# Patient Record
Sex: Male | Born: 1990 | Race: White | Hispanic: No | Smoking: Current every day smoker
Health system: Southern US, Community
[De-identification: ages and names within clinical notes are randomized; demographics above are authoritative.]

---

## 2011-07-31 ENCOUNTER — Encounter (HOSPITAL_COMMUNITY): Payer: Self-pay | Admitting: *Deleted

## 2011-07-31 ENCOUNTER — Emergency Department (HOSPITAL_COMMUNITY)
Admission: EM | Admit: 2011-07-31 | Discharge: 2011-07-31 | Disposition: A | Discharge status: Home / Self Care | Payer: Self-pay | Attending: Emergency Medicine | Admitting: Emergency Medicine

## 2011-07-31 DIAGNOSIS — M67919 Unspecified disorder of synovium and tendon, unspecified shoulder: Secondary | ICD-10-CM | POA: Insufficient documentation

## 2011-07-31 DIAGNOSIS — IMO0001 Reserved for inherently not codable concepts without codable children: Secondary | ICD-10-CM | POA: Insufficient documentation

## 2011-07-31 DIAGNOSIS — M7580 Other shoulder lesions, unspecified shoulder: Secondary | ICD-10-CM

## 2011-07-31 DIAGNOSIS — M719 Bursopathy, unspecified: Secondary | ICD-10-CM | POA: Insufficient documentation

## 2011-07-31 MED ORDER — IBUPROFEN 800 MG PO TABS: 800.0000 mg | ORAL_TABLET | Freq: Four times a day (QID) | ORAL | Status: AC | PRN

## 2011-07-31 MED ORDER — IBUPROFEN 800 MG PO TABS
800.0000 mg | ORAL_TABLET | Freq: Once | ORAL | Status: AC
  Administered 2011-07-31: 800 mg via ORAL
  Filled 2011-07-31: qty 1

## 2011-07-31 NOTE — Discharge Instructions (Signed)
Rotator Cuff Tendonitis  The rotator cuff is the collection of all the muscles and tendons (the supraspinatus, infraspinatus, subscapularis, and teres minor muscles and their tendons) that help your shoulder stay in place. This unit holds the head of the upper arm bone (humerus) in the cup (fossa) of the shoulder blade (scapula). Basically, it connects the arm to the shoulder. Tendinitis is a swelling and irritation of the tissue, called cord like structures (tendons) that connect muscle to bone. It usually is caused by overusing the joint involved. When the tissue surrounding a tendon (the synovium) becomes inflamed, it is called tenosynovitis. This also is often the result of overuse in people whose jobs require repetitive (over and over again) types of motion. HOME CARE INSTRUCTIONS   Use a sling or splint for as long as directed by your caregiver until the pain decreases.   Apply ice to the injury for 15 to 20 minutes, 3 to 4 times per day. Put the ice in a plastic bag and place a towel between the bag of ice and your skin.   Try to avoid use other than gentle range of motion while your shoulder is painful. Use and exercise only as directed by your caregiver. Stop exercises or range of motion if pain or discomfort increases, unless directed otherwise by your caregiver.   Only take over-the-counter or prescription medicines for pain, discomfort, or fever as directed by your caregiver.   If you were give a shoulder sling and straps (immobilizer), do not remove it except as directed, or until you see a caregiver for a follow-up examination. If you need to remove it, move your arm as little as possible or as directed.   You may want to sleep on several pillows at night to lessen swelling and pain.  SEEK IMMEDIATE MEDICAL CARE IF:   Pain in your shoulder increases or new pain develops in your arm, hand, or fingers and is not relieved with medications.   You develop new, unexplained symptoms,  especially increased numbness in the hands or loss of strength, or you develop any worsening of the problems which brought you in for care.   Your arm, hand, or fingers are numb or tingling.   Your arm, hand, or fingers are swollen, painful, or turn white or blue.  Document Released: 06/19/2003 Document Revised: 03/18/2011 Document Reviewed: 01/25/2008 [employer] Patient Information 2012 [employer]. 

## 2011-07-31 NOTE — ED Provider Notes (Signed)
History     CSN: [id number]  Arrival date & time 07/31/11  1932   First MD Initiated Contact with Patient 07/31/11 2054      Chief Complaint  Patient presents with  . Shoulder Pain    (Consider location/radiation/quality/duration/timing/severity/associated sxs/prior treatment) HPI Comments: Patient here with right shoulder pain - states that when he was in the [employer] they told him he had right rotator cuff tear - states that he played golf several days ago and that since the pain has returned - reports pain more with movement - has full ROM and denies weakness, numbness or tingling.  Patient is a 21 y.o. male presenting with shoulder pain. The history is provided by the patient. No language interpreter was used.  Shoulder Pain This is a new problem. The current episode started in the past 7 days. The problem occurs constantly. The problem has been unchanged. Associated symptoms include arthralgias and myalgias. Pertinent negatives include no abdominal pain, anorexia, change in bowel habit, chest pain, chills, congestion, coughing, diaphoresis, fatigue, fever, headaches, joint swelling, nausea, neck pain, numbness, rash, sore throat, swollen glands, urinary symptoms, vertigo, visual change, vomiting or weakness. The symptoms are aggravated by twisting. He has tried nothing for the symptoms. The treatment provided no relief.    History reviewed. No pertinent past medical history.  History reviewed. No pertinent past surgical history.  No family history on file.  History  Substance Use Topics  . Smoking status: Current Everyday Smoker  . Smokeless tobacco: Not on file  . Alcohol Use: Yes      Review of Systems  Constitutional: Negative for fever, chills, diaphoresis and fatigue.  HENT: Negative for congestion, sore throat and neck pain.   Respiratory: Negative for cough.   Cardiovascular: Negative for chest pain.  Gastrointestinal: Negative for nausea, vomiting, abdominal pain,  anorexia and change in bowel habit.  Musculoskeletal: Positive for myalgias and arthralgias. Negative for joint swelling.  Skin: Negative for rash.  Neurological: Negative for vertigo, weakness, numbness and headaches.  All other systems reviewed and are negative.    Allergies  Review of patient's allergies indicates no known allergies.  Home Medications   Current Outpatient Rx  Name Route Sig Dispense Refill  . GOODY HEADACHE PO Oral Take 1 packet by mouth once.      BP 113/55  Pulse 81  Temp(Src) 98.5 F (36.9 C) (Oral)  Resp 18  SpO2 100%  Physical Exam  Nursing note and vitals reviewed. Constitutional: He is oriented to person, place, and time. He appears well-developed and well-nourished. No distress.  HENT:  Head: Normocephalic and atraumatic.  Right Ear: External ear normal.  Left Ear: External ear normal.  Nose: Nose normal.  Mouth/Throat: Oropharynx is clear and moist. No oropharyngeal exudate.  Eyes: Conjunctivae are normal. Pupils are equal, round, and reactive to light. No scleral icterus.  Neck: Normal range of motion. Neck supple.  Cardiovascular: Normal rate, regular rhythm and normal heart sounds.  Exam reveals no gallop and no friction rub.   No murmur heard. Pulmonary/Chest: Effort normal and breath sounds normal. No respiratory distress. He has no wheezes. He has no rales. He exhibits no tenderness.  Abdominal: Soft. Bowel sounds are normal. He exhibits no distension. There is no tenderness.  Musculoskeletal: Normal range of motion.       Full ROM of right shoulder - pain to palpation of insertion of supraspinatus, no signs of impingement - no sensory deficit distal to the pain.  Lymphadenopathy:  He has no cervical adenopathy.  Neurological: He is alert and oriented to person, place, and time. No cranial nerve deficit.  Skin: Skin is warm and dry. No rash noted. No erythema. No pallor.  Psychiatric: He has a normal mood and affect. His behavior is  normal. Judgment and thought content normal.    ED Course  Procedures (including critical care time)  Labs Reviewed - No data to display No results found.   Rotator Cuff tendonitis    MDM  Though patient reports a PMH for rotator cuff tear, I find no decrease in ROM or signs of impingement on physical exam.  I will place him in a sling for support, RICE the injury and refer to ortho.        Izola Price [place], [place] 07/31/11 2116

## 2011-07-31 NOTE — Progress Notes (Signed)
Orthopedic Tech Progress Note Patient Details:  Earl Marks February 08, 1991 [id number]  Other Ortho Devices Type of Ortho Device: Other (comment) (sling immobilizer) Ortho Device Location: (R) UE Ortho Device Interventions: Application   [name] 07/31/2011, 9:07 PM

## 2011-07-31 NOTE — ED Notes (Signed)
The pt has  Had rt shoulder pain for years more pain for 2-3 days.  No new injury

## 2011-08-01 NOTE — ED Provider Notes (Signed)
Medical screening examination/treatment/procedure(s) were performed by non-physician practitioner and as supervising physician I was immediately available for consultation/collaboration.   Carleene Cooper III, MD 08/01/11 [phone]

## 2011-09-07 ENCOUNTER — Encounter (HOSPITAL_COMMUNITY): Payer: Self-pay | Admitting: *Deleted

## 2011-09-07 ENCOUNTER — Emergency Department (HOSPITAL_COMMUNITY)
Admission: EM | Admit: 2011-09-07 | Discharge: 2011-09-07 | Disposition: A | Discharge status: Home / Self Care | Payer: Self-pay | Attending: Emergency Medicine | Admitting: Emergency Medicine

## 2011-09-07 ENCOUNTER — Emergency Department (HOSPITAL_COMMUNITY): Payer: Self-pay

## 2011-09-07 DIAGNOSIS — F17200 Nicotine dependence, unspecified, uncomplicated: Secondary | ICD-10-CM | POA: Insufficient documentation

## 2011-09-07 DIAGNOSIS — Y990 Civilian activity done for income or pay: Secondary | ICD-10-CM | POA: Insufficient documentation

## 2011-09-07 DIAGNOSIS — W230XXA Caught, crushed, jammed, or pinched between moving objects, initial encounter: Secondary | ICD-10-CM | POA: Insufficient documentation

## 2011-09-07 DIAGNOSIS — S6010XA Contusion of unspecified finger with damage to nail, initial encounter: Secondary | ICD-10-CM

## 2011-09-07 DIAGNOSIS — S6000XA Contusion of unspecified finger without damage to nail, initial encounter: Secondary | ICD-10-CM | POA: Insufficient documentation

## 2011-09-07 MED ORDER — IBUPROFEN 600 MG PO TABS: 600.0000 mg | ORAL_TABLET | Freq: Four times a day (QID) | ORAL | Status: AC | PRN

## 2011-09-07 MED ORDER — HYDROCODONE-ACETAMINOPHEN 5-500 MG PO TABS
1.0000 | ORAL_TABLET | Freq: Four times a day (QID) | ORAL | Status: AC | PRN
Start: 2011-09-07 — End: 2011-09-17

## 2011-09-07 NOTE — ED Notes (Signed)
Patient dropped cement block on right index finger, patient with bruising  to nail bed and decreased rom in finger

## 2011-09-07 NOTE — Discharge Instructions (Signed)
Elevate finger. Ice/coldpack to sore area. Take motrin as need for pain. You may also take vicodin as need for pain. No driving when taking vicodin. Also, do not take tylenol or acetaminophen containing medication when taking vicodin. Return to ER if worse, new symptoms, infection of finger, severe pain, other concern.     Cryotherapy Cryotherapy means treatment with cold. Ice or gel packs can be used to reduce both pain and swelling. Ice is the most helpful within the first 24 to 48 hours after an injury or flareup from overusing a muscle or joint. Sprains, strains, spasms, burning pain, shooting pain, and aches can all be eased with ice. Ice can also be used when recovering from surgery. Ice is effective, has very few side effects, and is safe for most people to use. PRECAUTIONS  Ice is not a safe treatment option for people with:  Raynaud's phenomenon. This is a condition affecting small blood vessels in the extremities. Exposure to cold may cause your problems to return.   Cold hypersensitivity. There are many forms of cold hypersensitivity, including:   Cold urticaria. Red, itchy hives appear on the skin when the tissues begin to warm after being iced.   Cold erythema. This is a red, itchy rash caused by exposure to cold.   Cold hemoglobinuria. Red blood cells break down when the tissues begin to warm after being iced. The hemoglobin that carry oxygen are passed into the urine because they cannot combine with blood proteins fast enough.   Numbness or altered sensitivity in the area being iced.  If you have any of the following conditions, do not use ice until you have discussed cryotherapy with your caregiver:  Heart conditions, such as arrhythmia, angina, or chronic heart disease.   High blood pressure.   Healing wounds or open skin in the area being iced.   Current infections.   Rheumatoid arthritis.   Poor circulation.   Diabetes.  Ice slows the blood flow in the region  it is applied. This is beneficial when trying to stop inflamed tissues from spreading irritating chemicals to surrounding tissues. However, if you expose your skin to cold temperatures for too long or without the proper protection, you can damage your skin or nerves. Watch for signs of skin damage due to cold. HOME CARE INSTRUCTIONS Follow these tips to use ice and cold packs safely.  Place a dry or damp towel between the ice and skin. A damp towel will cool the skin more quickly, so you may need to shorten the time that the ice is used.   For a more rapid response, add gentle compression to the ice.   Ice for no more than 10 to 20 minutes at a time. The bonier the area you are icing, the less time it will take to get the benefits of ice.   Check your skin after 5 minutes to make sure there are no signs of a poor response to cold or skin damage.   Rest 20 minutes or more in between uses.   Once your skin is numb, you can end your treatment. You can test numbness by very lightly touching your skin. The touch should be so light that you do not see the skin dimple from the pressure of your fingertip. When using ice, most people will feel these normal sensations in this order: cold, burning, aching, and numbness.   Do not use ice on someone who cannot communicate their responses to pain, such as small children  or people with dementia.  HOW TO MAKE AN ICE PACK Ice packs are the most common way to use ice therapy. Other methods include ice massage, ice baths, and cryo-sprays. Muscle creams that cause a cold, tingly feeling do not offer the same benefits that ice offers and should not be used as a substitute unless recommended by your caregiver. To make an ice pack, do one of the following:  Place crushed ice or a bag of frozen vegetables in a sealable plastic bag. Squeeze out the excess air. Place this bag inside another plastic bag. Slide the bag into a pillowcase or place a damp towel between your  skin and the bag.   Mix 3 parts water with 1 part rubbing alcohol. Freeze the mixture in a sealable plastic bag. When you remove the mixture from the freezer, it will be slushy. Squeeze out the excess air. Place this bag inside another plastic bag. Slide the bag into a pillowcase or place a damp towel between your skin and the bag.  SEEK MEDICAL CARE IF:  You develop white spots on your skin. This may give the skin a blotchy (mottled) appearance.   Your skin turns blue or pale.   Your skin becomes waxy or hard.   Your swelling gets worse.  MAKE SURE YOU:   Understand these instructions.   Will watch your condition.   Will get help right away if you are not doing well or get worse.  Document Released: 11/23/2010 Document Revised: 03/18/2011 Document Reviewed: 11/23/2010 [facility] Patient Information 2012 [place], [place].

## 2011-09-07 NOTE — ED Provider Notes (Signed)
History  Scribed for Earl Roots, MD, the patient was seen in room STRE2/STRE2. This chart was scribed by Candelaria Stagers. The patient's care started at 11:36 AM    CSN: [id number]  Arrival date & time 09/07/11  1107   None     Chief Complaint  Patient presents with  . Finger Injury    right index    Hand Injury  The incident occurred 1 to 3 hours ago. The incident occurred at work. Injury mechanism: smashed between cement blocks. Pain location: right index finger. The pain does not radiate. The pain has been constant since the incident. Pertinent negatives include no numbness. He has tried nothing for the symptoms.   [name] is a 21 y.o. 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  Pt denies numbness or loss of sensation to the hand.  There are no lacerations.  The right index finger is swollen.   History reviewed. No pertinent past medical history.  History reviewed. No pertinent past surgical history.  No family history on file.  History  Substance Use Topics  . Smoking status: Current Everyday Smoker  . Smokeless tobacco: Not on file  . Alcohol Use: Yes      Review of Systems  Musculoskeletal: Positive for joint swelling.       Injury to the index finger of the right hand  Neurological: Negative for weakness and numbness.    Allergies  Review of patient's allergies indicates no known allergies.  Home Medications   Current Outpatient Rx  Name Route Sig Dispense Refill  . GOODY HEADACHE PO Oral Take 1 packet by mouth once.      BP 111/53  Temp(Src) 97.9 F (36.6 C) (Oral)  Resp 20  SpO2 100%  Physical Exam  Nursing note and vitals reviewed. Constitutional: He is oriented to person, place, and time. He appears well-developed and well-nourished. No distress.  HENT:  Head:  Normocephalic and atraumatic.  Eyes: EOM are normal. Right eye exhibits no discharge. Left eye exhibits no discharge.  Musculoskeletal:       Pain and swelling to the index finger of the right hand. Large subungual hematoma. Normal cap refill distally. Normal tendon fxn. Skin intact.   Neurological: He is alert and oriented to person, place, and time.       Finger nvi.   Skin: He is not diaphoretic.  Psychiatric: He has a normal mood and affect. His behavior is normal.    ED Course  Procedures  DIAGNOSTIC STUDIES: Oxygen Saturation is 100% on room air, normal by my interpretation.    COORDINATION OF CARE:  11:38 AM DG Finger Right Index    Labs Reviewed - No data to display Dg Finger Index Right  09/07/2011  *RADIOLOGY REPORT*  Clinical Data: Pain after trauma  RIGHT INDEX FINGER 2+V  Comparison: None.  Findings: There is no evidence of bone, joint, or soft tissue abnormality.  IMPRESSION: Normal right index finger.  Original Report Authenticated By: Brandon Melnick, M.D.        MDM  I personally performed the services described in this documentation, which was scribed in my presence. The recorded information has been reviewed and considered. Earl Roots, MD   Pt with approximately 75% subungual hematoma,   Digit block with 2% lidocaine without epi Sterile prep, betadine/alc swabs  Total 5 cc used.  Good anesthetic effect. Normal cap refill distally.    Nail trephinated with disposable cautery. Tolerated well. Dressing.           Earl Roots, MD 09/07/11 1259

## 2012-03-03 ENCOUNTER — Emergency Department (HOSPITAL_COMMUNITY)
Admission: EM | Admit: 2012-03-03 | Discharge: 2012-03-03 | Disposition: A | Discharge status: Home / Self Care | Payer: Self-pay | Attending: Emergency Medicine | Admitting: Emergency Medicine

## 2012-03-03 ENCOUNTER — Encounter (HOSPITAL_COMMUNITY): Payer: Self-pay | Admitting: *Deleted

## 2012-03-03 DIAGNOSIS — K089 Disorder of teeth and supporting structures, unspecified: Secondary | ICD-10-CM | POA: Insufficient documentation

## 2012-03-03 DIAGNOSIS — R51 Headache: Secondary | ICD-10-CM | POA: Insufficient documentation

## 2012-03-03 DIAGNOSIS — K029 Dental caries, unspecified: Secondary | ICD-10-CM

## 2012-03-03 DIAGNOSIS — Z7982 Long term (current) use of aspirin: Secondary | ICD-10-CM | POA: Insufficient documentation

## 2012-03-03 DIAGNOSIS — K0889 Other specified disorders of teeth and supporting structures: Secondary | ICD-10-CM

## 2012-03-03 DIAGNOSIS — H9209 Otalgia, unspecified ear: Secondary | ICD-10-CM | POA: Insufficient documentation

## 2012-03-03 DIAGNOSIS — F17200 Nicotine dependence, unspecified, uncomplicated: Secondary | ICD-10-CM | POA: Insufficient documentation

## 2012-03-03 MED ORDER — OXYCODONE-ACETAMINOPHEN 5-325 MG PO TABS: 1.0000 | ORAL_TABLET | ORAL | Status: DC | PRN

## 2012-03-03 MED ORDER — PENICILLIN V POTASSIUM 500 MG PO TABS: 500.0000 mg | ORAL_TABLET | Freq: Four times a day (QID) | ORAL | Status: AC

## 2012-03-03 MED ORDER — OXYCODONE-ACETAMINOPHEN 5-325 MG PO TABS
2.0000 | ORAL_TABLET | Freq: Once | ORAL | Status: AC
  Administered 2012-03-03: 2 via ORAL
  Filled 2012-03-03: qty 2

## 2012-03-03 NOTE — ED Notes (Signed)
Patient is alert and orientedx4.  Patient was explained discharge instructions and he understood them and had no questions.

## 2012-03-03 NOTE — ED Notes (Signed)
Patient said he has a tooth ache for several months and he usually takes tylenol or ibuprofen for the pain and it relieved it. Today he came in because nothing is relieving the pain.  Patient did not realize one of his teeth on the top right side of his mouth was broken.  Patient came in to be evaluated.

## 2012-03-03 NOTE — ED Notes (Signed)
C/o bilateral upper toothache and lower R toothache (3 quadrants), comes and goes, onset today at 1330, (denies: nv, fever, drainage, dizziness), admits to some possible swelling & R earache. Tried ibuprofen, oragel and goodies powder w/o relief.

## 2012-03-03 NOTE — ED Provider Notes (Signed)
History     CSN: [id number]  Arrival date & time 03/03/12  2144   First MD Initiated Contact with Patient 03/03/12 2243      Chief Complaint  Patient presents with  . Dental Pain    (Consider location/radiation/quality/duration/timing/severity/associated sxs/prior treatment) HPI  Earl Marks is a 21 y.o. male presents emergency department complaining of toothache.  He states he has a history of cavities that he woke this morning with his teeth hurting.  Patient states he has pain in the upper right, lower right and upper left.  He states the pain began gradually, has been persistent and is gradually worsening. He states he does not have a dentist in the area. He has associated bad taste in his mouth, swelling of the gums and pain at the site.  He also has associated headache and otalgia the right ear. Nothing makes the pain better, cold drinks makes the pain worse. Patient has tried ibuprofen, Orajel and Goody's powder without relief. Patient denies fever, chills, neck pain, stiffness, nausea, vomiting.    History reviewed. No pertinent past medical history.  History reviewed. No pertinent past surgical history.  No family history on file.  History  Substance Use Topics  . Smoking status: Current Every Day Smoker  . Smokeless tobacco: Not on file  . Alcohol Use: Yes      Review of Systems  Constitutional: Negative for fever, chills and appetite change.  HENT: Positive for ear pain (right) and dental problem. Negative for nosebleeds, facial swelling, rhinorrhea, drooling, trouble swallowing, neck pain, neck stiffness and postnasal drip.   Eyes: Negative for pain and redness.  Respiratory: Negative for cough and wheezing.   Cardiovascular: Negative for chest pain.  Gastrointestinal: Negative for nausea, vomiting and abdominal pain.  Skin: Negative for color change and rash.  Neurological: Positive for headaches. Negative for weakness and light-headedness.  All other systems  reviewed and are negative.    Allergies  Review of patient's allergies indicates no known allergies.  Home Medications   Current Outpatient Rx  Name  Route  Sig  Dispense  Refill  . ASPIRIN-ACETAMINOPHEN 500-325 MG PO PACK   Oral   Take 1 packet by mouth 2 (two) times daily as needed. For pain         . IBUPROFEN 200 MG PO TABS   Oral   Take 400 mg by mouth every 8 (eight) hours as needed. For pain         . OXYCODONE-ACETAMINOPHEN 5-325 MG PO TABS   Oral   Take 1 tablet by mouth every 4 (four) hours as needed for pain.   12 tablet   0   . PENICILLIN V POTASSIUM 500 MG PO TABS   Oral   Take 1 tablet (500 mg total) by mouth 4 (four) times daily.   40 tablet   0     BP 140/76  Pulse 89  Temp 98.2 F (36.8 C) (Oral)  Resp 18  SpO2 100%  Physical Exam  Nursing note and vitals reviewed. Constitutional: He appears well-developed and well-nourished.  HENT:  Head: Normocephalic and atraumatic.  Right Ear: Tympanic membrane, external ear and ear canal normal.  Left Ear: Tympanic membrane, external ear and ear canal normal.  Nose: Nose normal. Right sinus exhibits no maxillary sinus tenderness and no frontal sinus tenderness. Left sinus exhibits no maxillary sinus tenderness and no frontal sinus tenderness.  Mouth/Throat: Uvula is midline, oropharynx is clear and moist and mucous membranes are normal. No  oral lesions. Abnormal dentition. Dental caries present. No uvula swelling or lacerations. No oropharyngeal exudate, posterior oropharyngeal edema, posterior oropharyngeal erythema or tonsillar abscesses.    Eyes: Conjunctivae normal are normal. Right eye exhibits no discharge. Left eye exhibits no discharge.  Neck: Normal range of motion. Neck supple.  Cardiovascular: Normal rate, regular rhythm and normal heart sounds.   Pulmonary/Chest: Effort normal and breath sounds normal. No respiratory distress. He has no wheezes.  Lymphadenopathy:    He has no cervical  adenopathy.  Neurological: He is alert.  Skin: Skin is warm and dry. No rash noted.  Psychiatric: He has a normal mood and affect.    ED Course  Procedures (including critical care time)  Labs Reviewed - No data to display No results found.   1. Pain, dental   2. Dental caries       MDM  [name] is a dental pain and history of dental caries.  Patient with toothache.  No gross abscess.  Exam unconcerning for Ludwig's angina or spread of infection.  Will treat with penicillin and pain medicine.  Urged patient to follow-up with dentist.    1. Medications: Percocet, penicillin  2. Treatment: Rest, drink plenty of fluids, take medication as prescribed  3. Follow Up: With dentistry on Monday    Dierdre Forth, PA-C 03/03/12 2356

## 2012-03-04 NOTE — ED Provider Notes (Signed)
Medical screening examination/treatment/procedure(s) were performed by non-physician practitioner and as supervising physician I was immediately available for consultation/collaboration.  Donnetta Hutching, MD 03/04/12 [phone]

## 2012-03-29 ENCOUNTER — Emergency Department (HOSPITAL_COMMUNITY)
Admission: EM | Admit: 2012-03-29 | Discharge: 2012-03-29 | Discharge status: Against Medical Advice | Payer: Self-pay | Attending: Emergency Medicine | Admitting: Emergency Medicine

## 2012-03-29 ENCOUNTER — Encounter (HOSPITAL_COMMUNITY): Payer: Self-pay

## 2012-03-29 DIAGNOSIS — K089 Disorder of teeth and supporting structures, unspecified: Secondary | ICD-10-CM | POA: Insufficient documentation

## 2012-03-29 MED ORDER — BUPIVACAINE-EPINEPHRINE PF 0.5-1:200000 % IJ SOLN
1.8000 mL | Freq: Once | INTRAMUSCULAR | Status: DC
  Filled 2012-03-29: qty 1.8

## 2012-03-29 MED ORDER — BUPIVACAINE-EPINEPHRINE PF 0.5-1:200000 % IJ SOLN
INTRAMUSCULAR | Status: AC
  Filled 2012-03-29: qty 1.8

## 2012-03-29 NOTE — ED Notes (Signed)
Patient is no longer in the room. The patient's friend who he arrived with is no longer sitting in the waiting room. The patient asked to go to the restroom , and then has left. Awaited patients return to room x 10 minutes

## 2012-03-29 NOTE — ED Notes (Addendum)
Patient reports that he has been having dental pain x 2 weeks. Patient has 2 decayed teeth right upper and lower and right lower.  Patient states he has an appointment with a dentist next week with Dr. Warren Danes. Patient reports that he is out of pain medication/Oxycodone 5/325 mg.

## 2012-08-21 ENCOUNTER — Emergency Department (HOSPITAL_COMMUNITY)
Admission: EM | Admit: 2012-08-21 | Discharge: 2012-08-21 | Disposition: A | Discharge status: Home / Self Care | Payer: Commercial Indemnity | Attending: Emergency Medicine | Admitting: Emergency Medicine

## 2012-08-21 ENCOUNTER — Emergency Department (HOSPITAL_COMMUNITY): Payer: Commercial Indemnity

## 2012-08-21 ENCOUNTER — Encounter (HOSPITAL_COMMUNITY): Payer: Self-pay | Admitting: Emergency Medicine

## 2012-08-21 DIAGNOSIS — Y929 Unspecified place or not applicable: Secondary | ICD-10-CM | POA: Insufficient documentation

## 2012-08-21 DIAGNOSIS — F17200 Nicotine dependence, unspecified, uncomplicated: Secondary | ICD-10-CM | POA: Insufficient documentation

## 2012-08-21 DIAGNOSIS — S60229A Contusion of unspecified hand, initial encounter: Secondary | ICD-10-CM | POA: Insufficient documentation

## 2012-08-21 DIAGNOSIS — Y9389 Activity, other specified: Secondary | ICD-10-CM | POA: Insufficient documentation

## 2012-08-21 DIAGNOSIS — S60221A Contusion of right hand, initial encounter: Secondary | ICD-10-CM

## 2012-08-21 DIAGNOSIS — W3189XA Contact with other specified machinery, initial encounter: Secondary | ICD-10-CM | POA: Insufficient documentation

## 2012-08-21 MED ORDER — OXYCODONE-ACETAMINOPHEN 5-325 MG PO TABS
1.0000 | ORAL_TABLET | Freq: Once | ORAL | Status: AC
  Administered 2012-08-21: 1 via ORAL
  Filled 2012-08-21: qty 1

## 2012-08-21 MED ORDER — OXYCODONE-ACETAMINOPHEN 5-325 MG PO TABS: 1.0000 | ORAL_TABLET | Freq: Four times a day (QID) | ORAL | Status: DC | PRN

## 2012-08-21 NOTE — ED Provider Notes (Signed)
History  This chart was scribed for [employer]. Rubin Payor, MD by Ardeen Jourdain, ED Scribe. This patient was seen in room TR10C/TR10C and the patient's care was started at 1731.  CSN: [id number]  Arrival date & time 08/21/12  1512   First MD Initiated Contact with Patient 08/21/12 1731      Chief Complaint  Patient presents with  . Hand Pain    The history is provided by the patient. No language interpreter was used.    HPI Comments: Earl Marks is a 22 y.o. male who presents to the Emergency Department complaining of sudden onset, gradually worsening, constant right hand pain that began a few hours ago. Pt states he was working on [employer] when he hit it with his hand. He denies any other injures or symptoms at this time. He denies any numbness in the right hand.   History reviewed. No pertinent past medical history.  History reviewed. No pertinent past surgical history.  History reviewed. No pertinent family history.  History  Substance Use Topics  . Smoking status: Current Every Day Smoker  . Smokeless tobacco: Never Used  . Alcohol Use: No      Review of Systems  Musculoskeletal: Positive for arthralgias.       Right hand pain   All other systems reviewed and are negative.    Allergies  Review of patient's allergies indicates no known allergies.  Home Medications   Current Outpatient Rx  Name  Route  Sig  Dispense  Refill  . acetaminophen (TYLENOL) 325 MG tablet   Oral   Take 650 mg by mouth every 6 (six) hours as needed for pain.         Marland Kitchen oxyCODONE-acetaminophen (PERCOCET/ROXICET) 5-325 MG per tablet   Oral   Take 1-2 tablets by mouth every 6 (six) hours as needed for pain.   10 tablet   0     Triage Vitals: BP 119/67  Pulse 90  Temp(Src) 98.8 F (37.1 C) (Oral)  Resp 18  SpO2 98%  Physical Exam  Nursing note and vitals reviewed. Constitutional: He is oriented to person, place, and time. He appears well-developed and well-nourished. No  distress.  HENT:  Head: Normocephalic and atraumatic.  Eyes: EOM are normal. Pupils are equal, round, and reactive to light.  Neck: Normal range of motion. Neck supple. No tracheal deviation present.  Cardiovascular: Normal rate.   Pulmonary/Chest: Effort normal. No respiratory distress.  Abdominal: Soft. He exhibits no distension.  Musculoskeletal: Normal range of motion. He exhibits no edema.  Erythema and swelling to right 4th and 5th MCP joint, mild ecchymosis over 5th MCP joint. Mild swelling over 1st MCP joint. Mild tenderness over 4th and 5th MCP joints. Sensation intact. No tenderness to elbow or wrist. Skin intact  Neurological: He is alert and oriented to person, place, and time.  Skin: Skin is warm and dry.  Psychiatric: He has a normal mood and affect. His behavior is normal.    ED Course  Procedures (including critical care time)  5:56 PM-Discussed treatment plan which includes x-ray of the right hand and pain medication with pt at bedside and pt agreed to plan.    Labs Reviewed - No data to display Dg Hand Complete Right  08/21/2012  *RADIOLOGY REPORT*  Clinical Data: Pain  RIGHT HAND - COMPLETE 3+ VIEW  Comparison: None.  Findings: Three views of the right hand submitted.  No acute fracture or subluxation.  No radiopaque foreign body.  IMPRESSION:  No acute fracture or subluxation.   Original Report Authenticated By: Natasha Mead, M.D.      1. Contusion, hand, right, initial encounter       MDM  Patient with hand pain after hitting the ground while working on a lawnmower. X-rays negative for fracture. Tenderness over fourth and fifth MCP joints. Skin intact. We'll splint for comfort and will followup with hand as needed.      I personally performed the services described in this documentation, which was scribed in my presence. The recorded information has been reviewed and is accurate.     Juliet Rude. Rubin Payor, MD 08/21/12 1818

## 2012-08-21 NOTE — ED Notes (Signed)
Pt c/o right hand pain after hitting with lawn mower blade; mild swelling noted to knuckles

## 2012-08-21 NOTE — Progress Notes (Signed)
Orthopedic Tech Progress Note Patient Details:  Earl Marks Aug 30, 1990 [id number]  Ortho Devices Type of Ortho Device: Finger splint Ortho Device/Splint Location: RUE Ortho Device/Splint Interventions: Ordered;Application   [name] 08/21/2012, 6:08 PM

## 2012-08-21 NOTE — ED Notes (Signed)
Pt st's he was trying to change lawn mower blade when the wrench slipped and his hand hit the mover.  Pt c/o pain to right hand at 5th MP joint.

## 2012-08-29 ENCOUNTER — Emergency Department (HOSPITAL_COMMUNITY)
Admission: EM | Admit: 2012-08-29 | Discharge: 2012-08-29 | Disposition: A | Discharge status: Home / Self Care | Payer: Commercial Indemnity | Attending: Emergency Medicine | Admitting: Emergency Medicine

## 2012-08-29 ENCOUNTER — Encounter (HOSPITAL_COMMUNITY): Payer: Self-pay | Admitting: Emergency Medicine

## 2012-08-29 ENCOUNTER — Emergency Department (HOSPITAL_COMMUNITY): Payer: Commercial Indemnity

## 2012-08-29 DIAGNOSIS — F17200 Nicotine dependence, unspecified, uncomplicated: Secondary | ICD-10-CM | POA: Insufficient documentation

## 2012-08-29 DIAGNOSIS — Y929 Unspecified place or not applicable: Secondary | ICD-10-CM | POA: Insufficient documentation

## 2012-08-29 DIAGNOSIS — S60221A Contusion of right hand, initial encounter: Secondary | ICD-10-CM

## 2012-08-29 DIAGNOSIS — Z7982 Long term (current) use of aspirin: Secondary | ICD-10-CM | POA: Insufficient documentation

## 2012-08-29 DIAGNOSIS — S60229A Contusion of unspecified hand, initial encounter: Secondary | ICD-10-CM | POA: Insufficient documentation

## 2012-08-29 DIAGNOSIS — Y9389 Activity, other specified: Secondary | ICD-10-CM | POA: Insufficient documentation

## 2012-08-29 DIAGNOSIS — IMO0002 Reserved for concepts with insufficient information to code with codable children: Secondary | ICD-10-CM | POA: Insufficient documentation

## 2012-08-29 MED ORDER — HYDROCODONE-ACETAMINOPHEN 5-325 MG PO TABS
1.0000 | ORAL_TABLET | Freq: Once | ORAL | Status: AC
  Administered 2012-08-29: 1 via ORAL
  Filled 2012-08-29: qty 1

## 2012-08-29 MED ORDER — HYDROCODONE-ACETAMINOPHEN 5-325 MG PO TABS: 1.0000 | ORAL_TABLET | ORAL | Status: DC | PRN

## 2012-08-29 NOTE — ED Notes (Signed)
Pt stated that he was helping fix a board on a deck and his dad accidentally hit him on the hand with a hammer. Swelling to right hand noted.

## 2012-08-29 NOTE — ED Notes (Signed)
Pt discharged.Vital signs stable and GCS 15 

## 2012-08-29 NOTE — ED Notes (Signed)
Pt c/o right hand pain after hitting hand with hammer today

## 2012-08-29 NOTE — Progress Notes (Signed)
Orthopedic Tech Progress Note Patient Details:  Earl Marks 30-Apr-1990 [id number]  Ortho Devices Type of Ortho Device: Finger splint Ortho Device/Splint Location: right hand Ortho Device/Splint Interventions: Application   Nikki Dom 08/29/2012, 7:46 PM

## 2012-08-29 NOTE — ED Provider Notes (Signed)
History  This chart was scribed for non-physician practitioner Dierdre Forth, PA-C working with Dione Booze, MD, by Candelaria Stagers, ED Scribe. This patient was seen in room TR05C/TR05C and the patient's care was started at 7:25 PM   CSN: [id number]  Arrival date & time 08/29/12  1801   First MD Initiated Contact with Patient 08/29/12 1902      Chief Complaint  Patient presents with  . Hand Pain     The history is provided by the patient. No language interpreter was used.   HPI Comments: Earl Marks is a 22 y.o. male who presents to the Emergency Department complaining of right hand pain after hitting the hand with a hammer earlier today.  He is experiencing swelling and a contusion to the right hand.  Pt has had previous injury to this hand.  Bending the fingers makes the pain worse.  He has taken Advil with no relief.    History reviewed. No pertinent past medical history.  History reviewed. No pertinent past surgical history.  History reviewed. No pertinent family history.  History  Substance Use Topics  . Smoking status: Current Every Day Smoker -- 0.50 packs/day  . Smokeless tobacco: Never Used  . Alcohol Use: No      Review of Systems  Musculoskeletal: Positive for arthralgias (right hand pain).  All other systems reviewed and are negative.    Allergies  Review of patient's allergies indicates no known allergies.  Home Medications   Current Outpatient Rx  Name  Route  Sig  Dispense  Refill  . aspirin 325 MG tablet   Oral   Take 325 mg by mouth daily.         Marland Kitchen oxyCODONE-acetaminophen (PERCOCET/ROXICET) 5-325 MG per tablet   Oral   Take 1-2 tablets by mouth every 6 (six) hours as needed for pain.   10 tablet   0   . HYDROcodone-acetaminophen (NORCO/VICODIN) 5-325 MG per tablet   Oral   Take 1 tablet by mouth every 4 (four) hours as needed for pain.   5 tablet   0     BP 102/60  Pulse 98  Temp(Src) 97.3 F (36.3 C) (Oral)  Resp 16   SpO2 99%  Physical Exam  Nursing note and vitals reviewed. Constitutional: He is oriented to person, place, and time. He appears well-developed and well-nourished. No distress.  HENT:  Head: Normocephalic and atraumatic.  Mouth/Throat: Oropharynx is clear and moist. No oropharyngeal exudate.  Eyes: Conjunctivae are normal. Pupils are equal, round, and reactive to light. No scleral icterus.  Neck: Normal range of motion. Neck supple.  Cardiovascular: Normal rate, regular rhythm, normal heart sounds and intact distal pulses.   Capillary refill < 3 sec  Pulmonary/Chest: Effort normal and breath sounds normal. No respiratory distress. He has no wheezes.  Musculoskeletal: Normal range of motion. He exhibits tenderness. He exhibits no edema.       Right hand: He exhibits tenderness and bony tenderness. He exhibits normal range of motion, normal two-point discrimination, normal capillary refill, no deformity, no laceration and no swelling. Normal sensation noted. Normal strength noted.  Normal ROM of right hand.  Tenderness over 4th and 5th MCP joint.   Lymphadenopathy:    He has no cervical adenopathy.  Neurological: He is alert and oriented to person, place, and time. He exhibits normal muscle tone. Coordination normal.  Normal strength and sensation.    Skin: Skin is warm and dry. He is not diaphoretic.  Contusion over 4th  and 5th MCP joint.   Psychiatric: He has a normal mood and affect.    ED Course  Procedures  DIAGNOSTIC STUDIES: Oxygen Saturation is 99% on room air, normal by my interpretation.    COORDINATION OF CARE:  7:27 PM Discussed course of care with pt which includes hand splint and follow up with hand specialist is symptoms do not improve.  Pt understands and agrees.    Labs Reviewed - No data to display Dg Hand Complete Right  08/29/2012   *RADIOLOGY REPORT*  Clinical Data: Hit hand with hammer.  Pain and swelling, mainly in region of little finger.  RIGHT HAND -  COMPLETE 3+ VIEW  Comparison: None.  Findings: No evidence of fracture or other significant bone abnormality.  Alignment is normal.  No evidence of arthropathy. Soft tissues are unremarkable.  IMPRESSION: Negative.   Original Report Authenticated By: Myles Rosenthal, M.D.     1. Contusion, hand, right, initial encounter       MDM  Earl Marks presents for repeat contusion to the right hand.  Patient X-Ray negative for obvious fracture or dislocation. I personally reviewed the imaging tests through PACS system.  I reviewed available ER/hospitalization records through the EMR.  Pain managed in ED. Pt advised to follow up with orthopedics if symptoms persist for possibility of missed fracture diagnosis. Patient given brace while in ED, conservative therapy recommended and discussed. Patient will be dc home & is agreeable with above plan.   I personally performed the services described in this documentation, which was scribed in my presence. The recorded information has been reviewed and is accurate.      Dierdre Forth, PA-C 08/29/12 1939

## 2012-08-30 NOTE — ED Provider Notes (Signed)
Medical screening examination/treatment/procedure(s) were performed by non-physician practitioner and as supervising physician I was immediately available for consultation/collaboration.   Stella Encarnacion, MD 08/30/12 0031 

## 2013-01-30 ENCOUNTER — Encounter (HOSPITAL_COMMUNITY): Payer: Self-pay | Admitting: Emergency Medicine

## 2013-01-30 ENCOUNTER — Emergency Department (HOSPITAL_COMMUNITY)
Admission: EM | Admit: 2013-01-30 | Discharge: 2013-01-30 | Disposition: A | Discharge status: Home / Self Care | Payer: Commercial Indemnity | Attending: Emergency Medicine | Admitting: Emergency Medicine

## 2013-01-30 DIAGNOSIS — F17200 Nicotine dependence, unspecified, uncomplicated: Secondary | ICD-10-CM | POA: Insufficient documentation

## 2013-01-30 DIAGNOSIS — Z7982 Long term (current) use of aspirin: Secondary | ICD-10-CM | POA: Insufficient documentation

## 2013-01-30 DIAGNOSIS — K029 Dental caries, unspecified: Secondary | ICD-10-CM | POA: Insufficient documentation

## 2013-01-30 MED ORDER — TRAMADOL HCL 50 MG PO TABS: 50.0000 mg | ORAL_TABLET | Freq: Four times a day (QID) | ORAL | Status: DC | PRN

## 2013-01-30 MED ORDER — TRAMADOL HCL 50 MG PO TABS
50.0000 mg | ORAL_TABLET | Freq: Once | ORAL | Status: AC
  Administered 2013-01-30: 50 mg via ORAL
  Filled 2013-01-30: qty 1

## 2013-01-30 NOTE — ED Provider Notes (Signed)
CSN: [id number]     Arrival date & time 01/30/13  2010 History   First MD Initiated Contact with Patient 01/30/13 2119     Chief Complaint  Patient presents with  . Dental Pain   (Consider location/radiation/quality/duration/timing/severity/associated sxs/prior Treatment) HPI Comments: Earl Marks states at dinner tonight.  He lost part of his right lower second molar that is now painful.  Has not taken any medication.  Prior to arrival.  He does not have a local dentist.  Patient is a 22 y.o. male presenting with tooth pain. The history is provided by the patient.  Dental Pain Location:  Lower Lower teeth location:  30/RL 1st molar Quality:  Aching Severity:  Mild Onset quality:  Sudden Timing:  Constant Progression:  Unchanged Chronicity:  New Context: dental caries and poor dentition   Relieved by:  None tried Worsened by:  Nothing tried Ineffective treatments:  None tried Associated symptoms: difficulty swallowing   Associated symptoms: no facial pain, no facial swelling, no fever, no gum swelling, no neck pain, no oral bleeding, no oral lesions and no trismus     History reviewed. No pertinent past medical history. History reviewed. No pertinent past surgical history. History reviewed. No pertinent family history. History  Substance Use Topics  . Smoking status: Current Every Day Smoker -- 0.50 packs/day  . Smokeless tobacco: Never Used  . Alcohol Use: No    Review of Systems  Constitutional: Negative for fever and chills.  HENT: Positive for dental problem. Negative for facial swelling and mouth sores.   Musculoskeletal: Negative for neck pain.  Skin: Negative for wound.  All other systems reviewed and are negative.    Allergies  Review of patient's allergies indicates no known allergies.  Home Medications   Current Outpatient Rx  Name  Route  Sig  Dispense  Refill  . acetaminophen (TYLENOL) 325 MG tablet   Oral   Take 650 mg by mouth every 6 (six) hours as  needed for pain.         Marland Kitchen aspirin 325 MG tablet   Oral   Take 325 mg by mouth daily.         . traMADol (ULTRAM) 50 MG tablet   Oral   Take 1 tablet (50 mg total) by mouth every 6 (six) hours as needed for pain.   30 tablet   0    BP 109/67  Pulse 83  Temp(Src) 97 F (36.1 C) (Oral)  Resp 18  Wt 140 lb (63.504 kg)  SpO2 99% Physical Exam  Nursing note and vitals reviewed. Constitutional: He is oriented to person, place, and time. He appears well-developed and well-nourished.  HENT:  Head: Normocephalic.  Right Ear: External ear normal.  Left Ear: External ear normal.  Mouth/Throat: Uvula is midline.    Eyes: Pupils are equal, round, and reactive to light.  Neck: Normal range of motion.  Cardiovascular: Normal rate and regular rhythm.   Pulmonary/Chest: Effort normal.  Musculoskeletal: Normal range of motion.  Lymphadenopathy:    He has no cervical adenopathy.  Neurological: He is alert and oriented to person, place, and time.  Skin: Skin is warm. No erythema.    ED Course  Procedures (including critical care time) Labs Review Labs Reviewed - No data to display Imaging Review No results found.  EKG Interpretation   None       MDM   1. Dental caries     There is no indication for infection or need for antibiotic.  Patient has been given Ultram for pain control, and referral to a, dentist.  He's been encouraged to call first thing in the morning to be seen in the For 48 hours    Arman Filter, NP 01/30/13 2203

## 2013-01-30 NOTE — ED Notes (Signed)
Unable to obtain e-signature due to signature pad not working.  Pt verbalized understanding of d/c and f/u. No additional questions by pt regarding d/c.  Ambulatory at d/c.

## 2013-01-30 NOTE — ED Notes (Signed)
Presents with right lower jaw dental pain from a chipped tooth that began at 6 pm tonight. Multiple dental caries noted to lower jaw. Also c/o chronic right shoulder pain. Worse with movement. Cms intact.

## 2013-01-30 NOTE — ED Notes (Signed)
Pt also c/o right sided chronic shoulder pain

## 2013-02-01 NOTE — ED Provider Notes (Signed)
Medical screening examination/treatment/procedure(s) were performed by non-physician practitioner and as supervising physician I was immediately available for consultation/collaboration.  EKG Interpretation   None        Advik Weatherspoon, MD 02/01/13 1418 

## 2013-03-18 IMAGING — CR DG FINGER INDEX 2+V*R*
3 series · 3 of 3 positions shown · non-contrast
Comparison: None.

CLINICAL DATA: Pain after trauma

RIGHT INDEX FINGER 2+V

[x finger pa right]
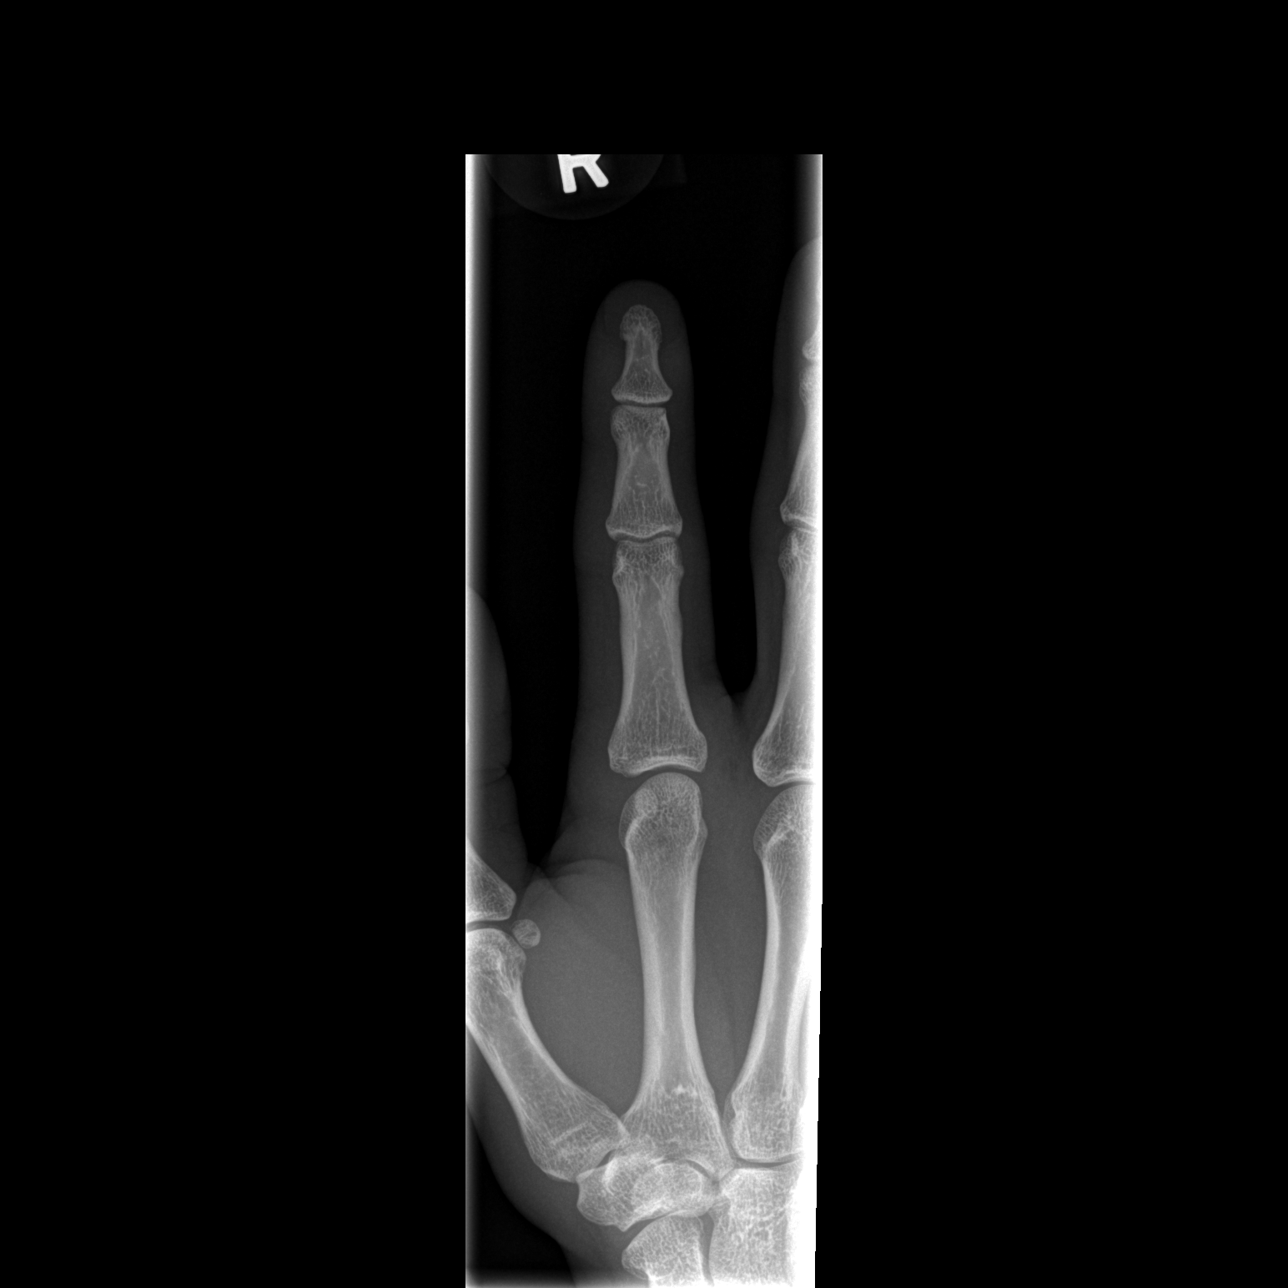

[x finger obl. right]
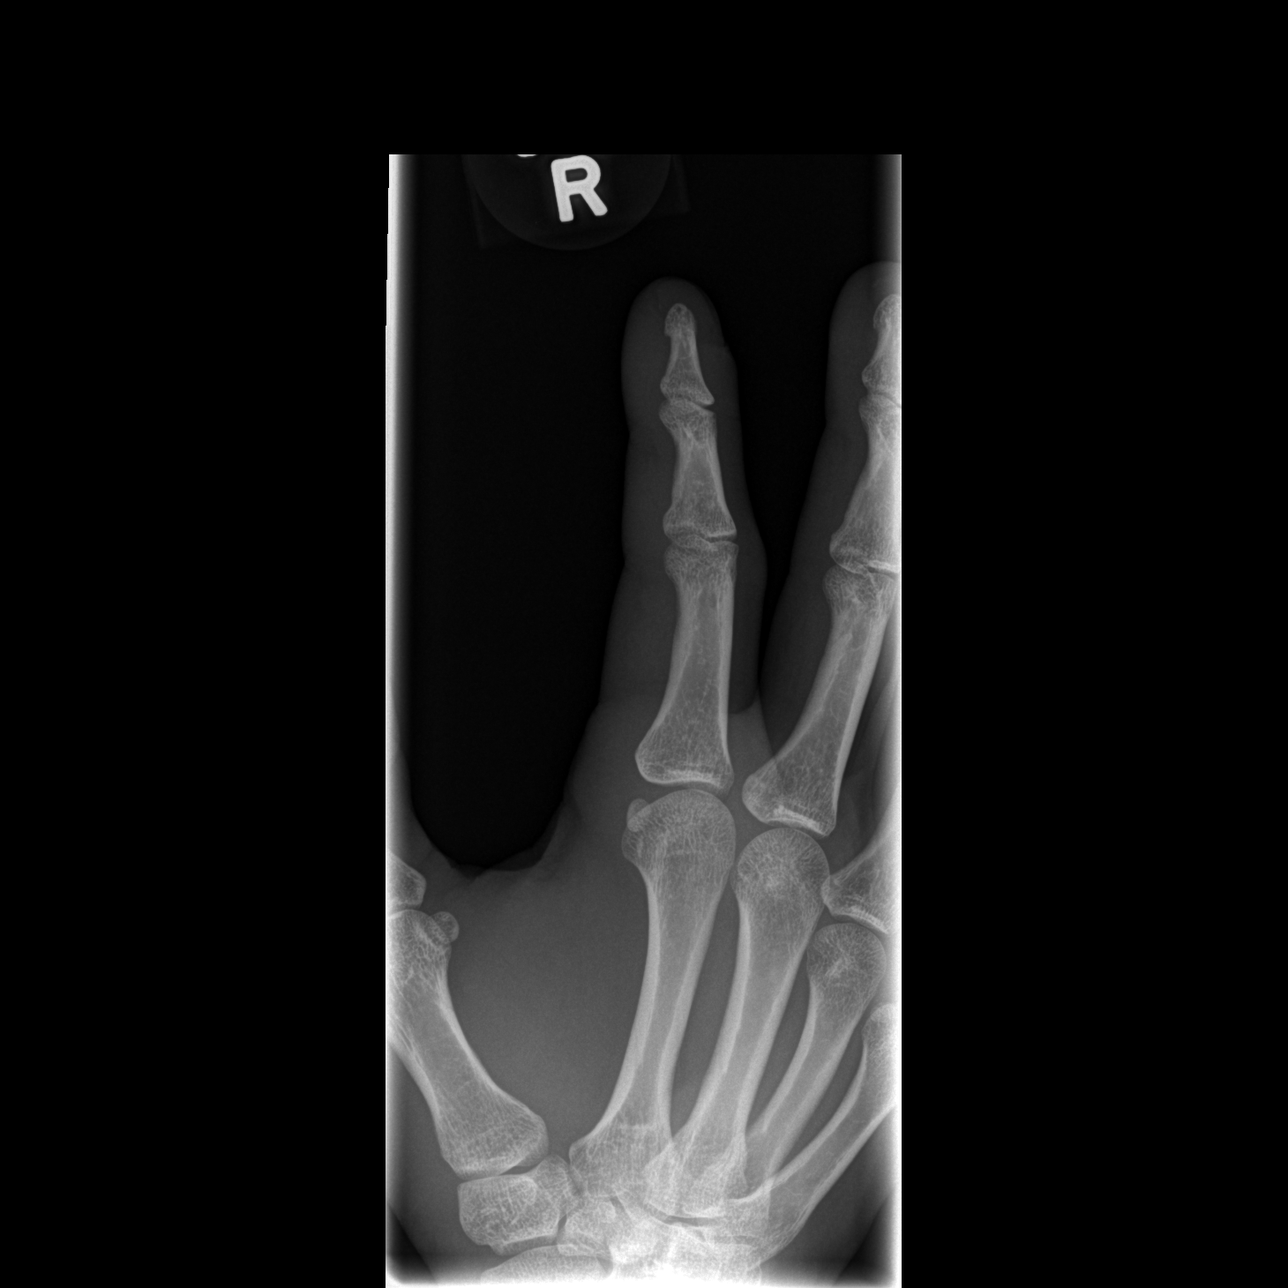

[x finger lateral right]
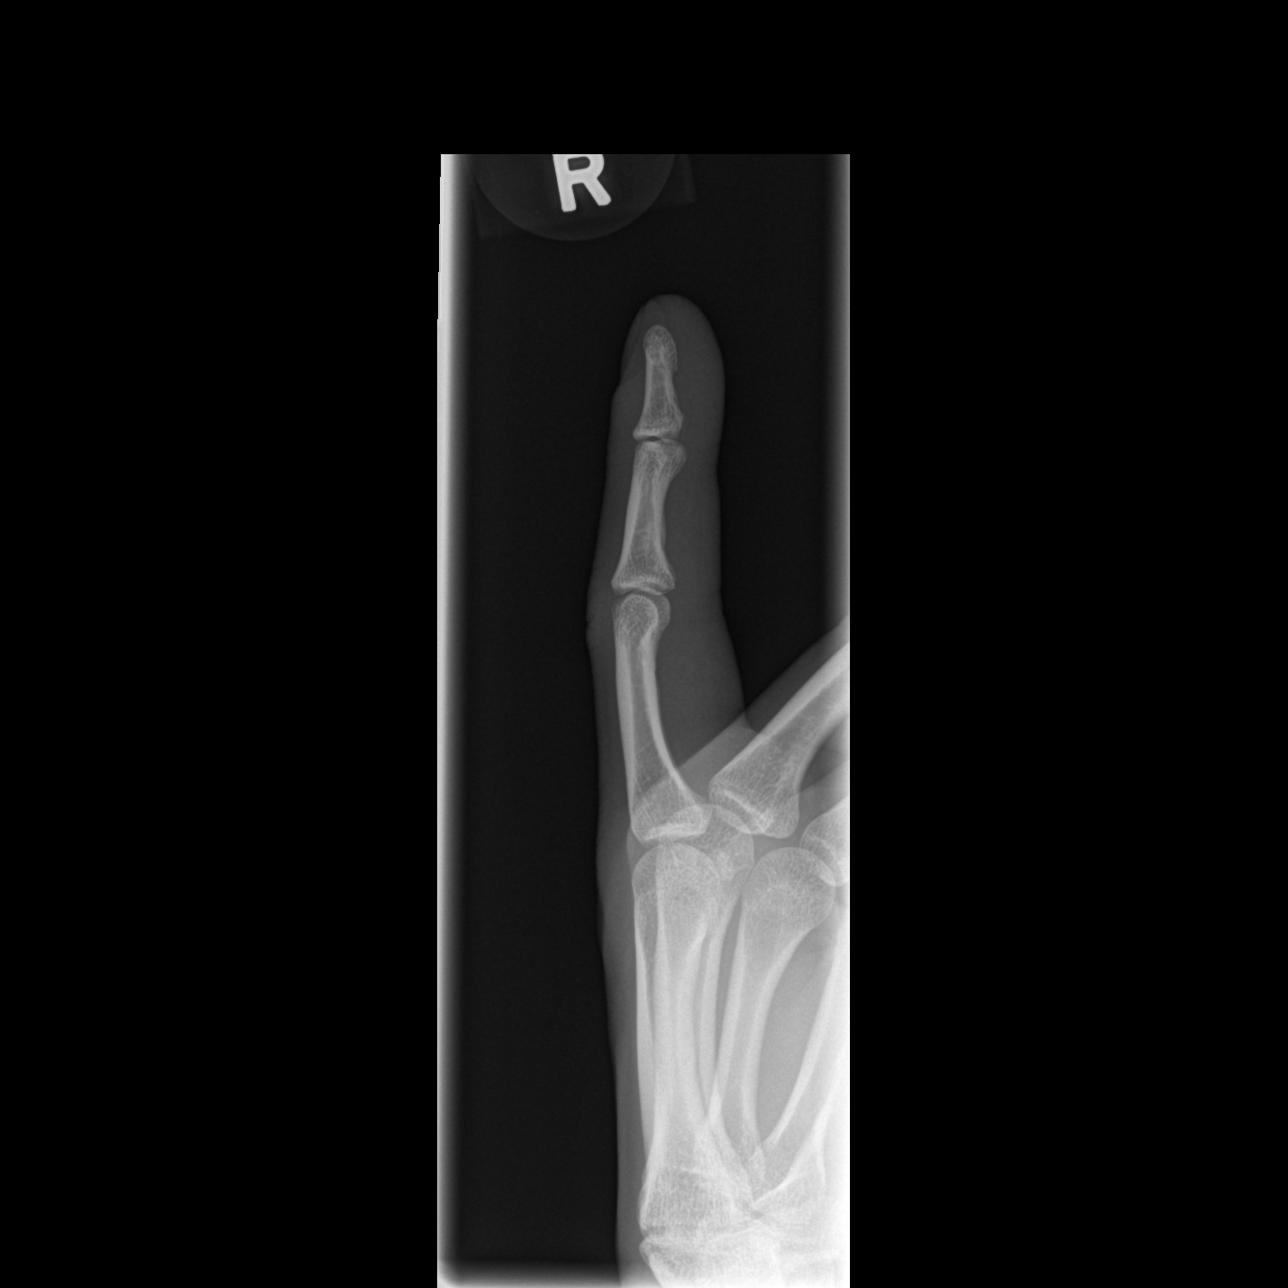

[3 of 3 positions shown; findings below may reference images not displayed]

FINDINGS: There is no evidence of bone, joint, or soft tissue
abnormality.
IMPRESSION: Normal right index finger.

## 2014-03-10 IMAGING — CR DG HAND COMPLETE 3+V*R*
3 series · 3 of 3 positions shown · non-contrast
Comparison: None.

CLINICAL DATA: Hit hand with hammer.  Pain and swelling, mainly in
region of little finger.

RIGHT HAND - COMPLETE 3+ VIEW

[x hand pa right]
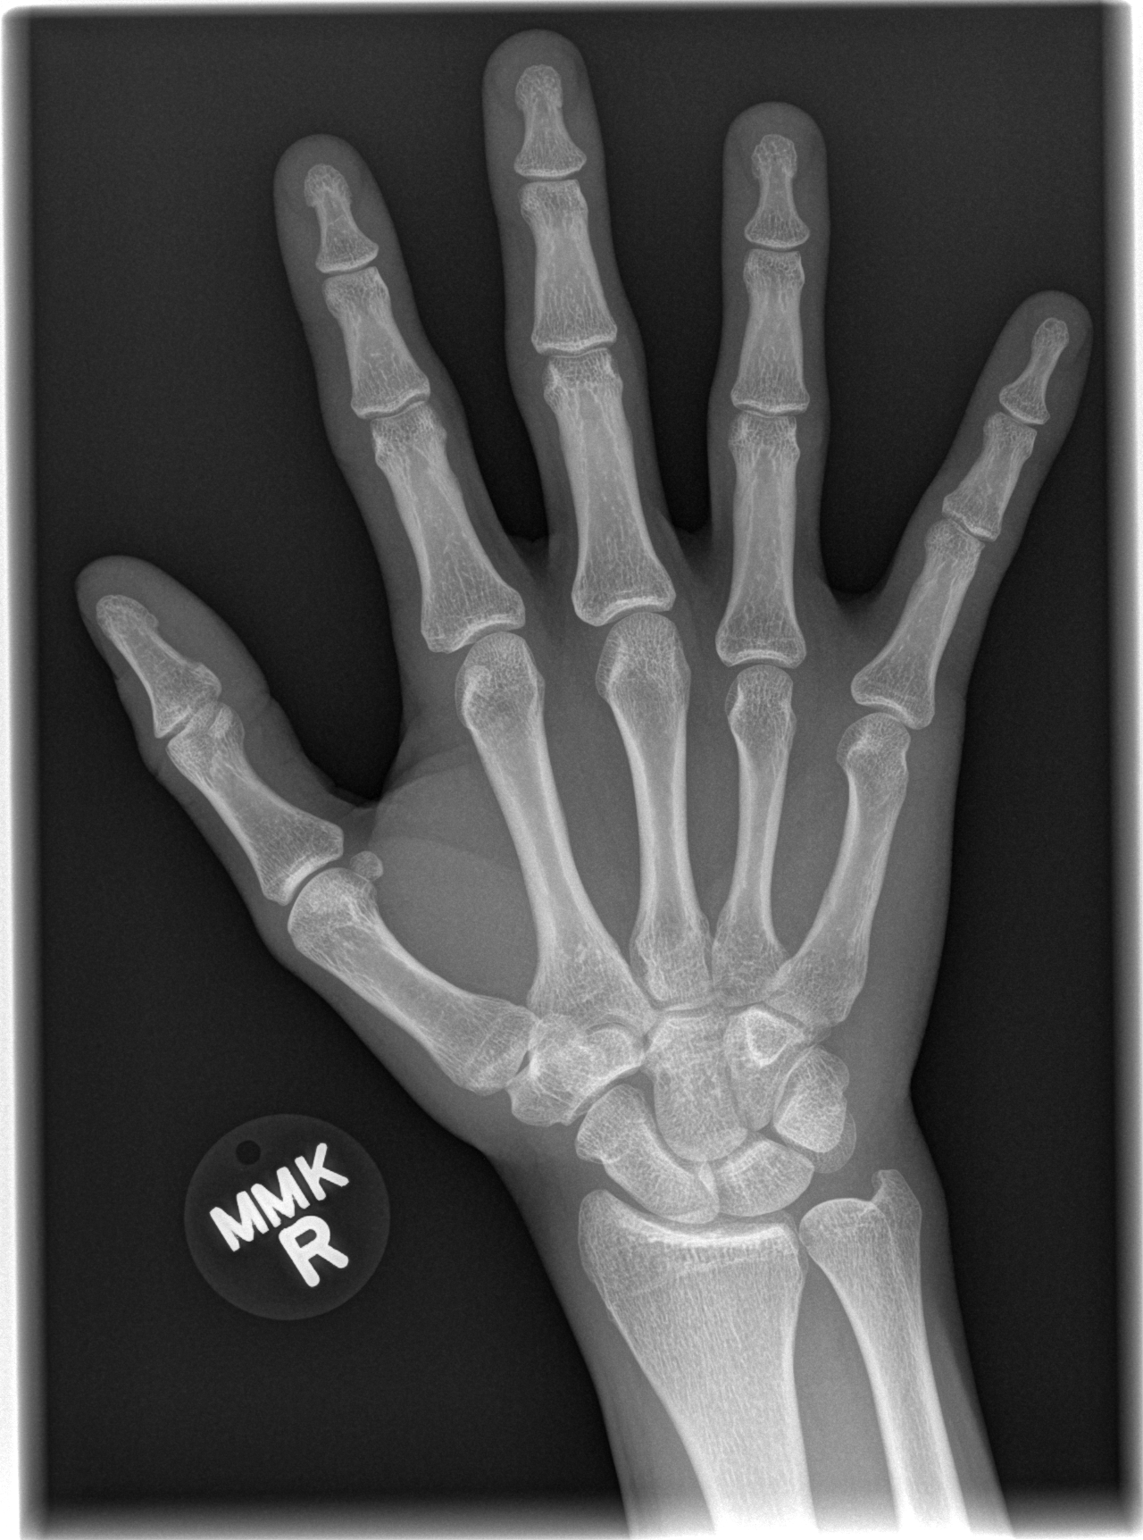

[x hand oblique right]
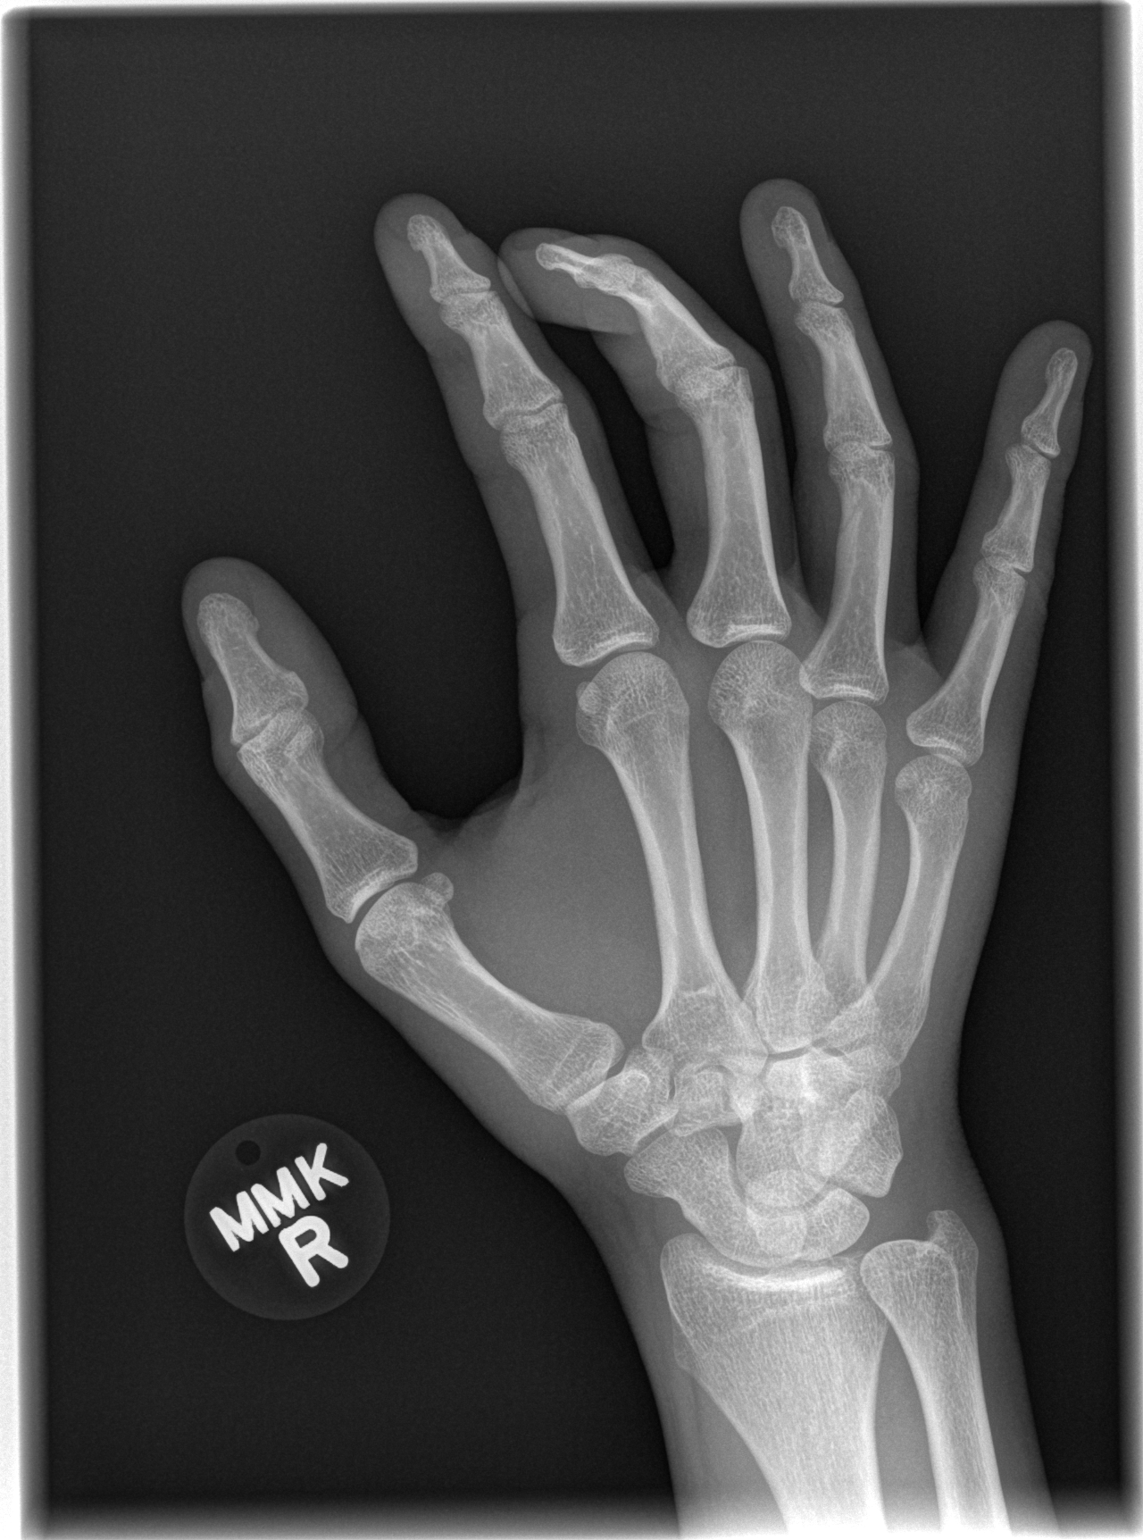

[x hand lat right]
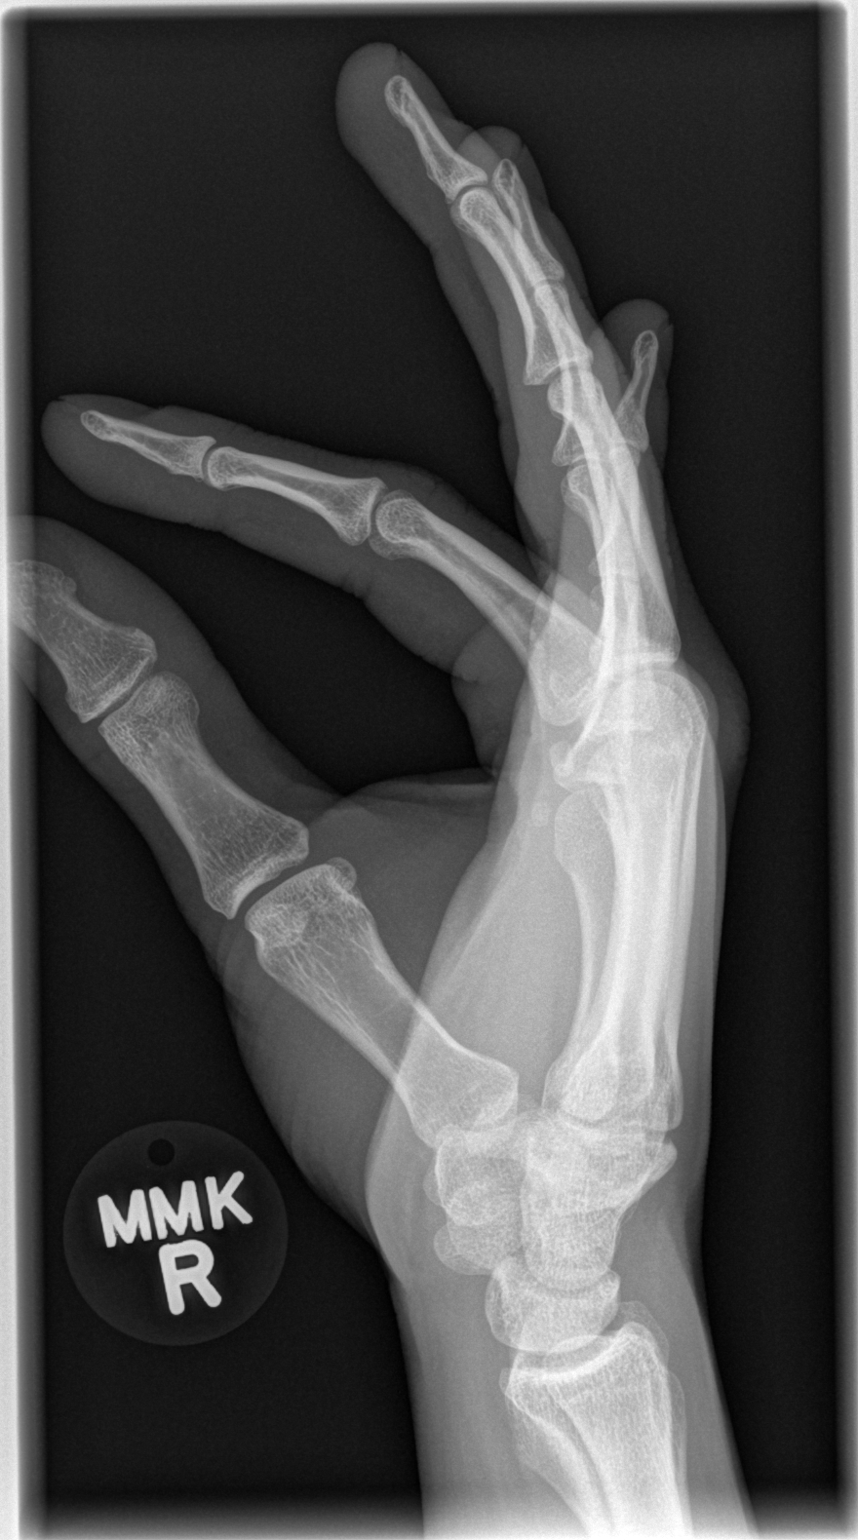

[3 of 3 positions shown; findings below may reference images not displayed]

FINDINGS: No evidence of fracture or other significant bone
abnormality.  Alignment is normal.  No evidence of arthropathy.
Soft tissues are unremarkable.
IMPRESSION: Negative.

## 2014-09-30 ENCOUNTER — Encounter (HOSPITAL_COMMUNITY): Payer: Self-pay | Admitting: *Deleted

## 2014-09-30 ENCOUNTER — Emergency Department (HOSPITAL_COMMUNITY)
Admission: EM | Admit: 2014-09-30 | Discharge: 2014-09-30 | Disposition: A | Discharge status: Home / Self Care | Payer: Commercial Indemnity | Attending: Emergency Medicine | Admitting: Emergency Medicine

## 2014-09-30 DIAGNOSIS — K029 Dental caries, unspecified: Secondary | ICD-10-CM

## 2014-09-30 DIAGNOSIS — Z720 Tobacco use: Secondary | ICD-10-CM | POA: Insufficient documentation

## 2014-09-30 DIAGNOSIS — K002 Abnormalities of size and form of teeth: Secondary | ICD-10-CM | POA: Insufficient documentation

## 2014-09-30 DIAGNOSIS — Z7982 Long term (current) use of aspirin: Secondary | ICD-10-CM | POA: Insufficient documentation

## 2014-09-30 MED ORDER — AMOXICILLIN 500 MG PO CAPS: 500.0000 mg | ORAL_CAPSULE | Freq: Three times a day (TID) | ORAL | Status: DC

## 2014-09-30 NOTE — ED Notes (Signed)
Declined W/C at D/C and was escorted to lobby by RN. 

## 2014-09-30 NOTE — ED Provider Notes (Signed)
CSN: [id number]     Arrival date & time 09/30/14  1503 History   First MD Initiated Contact with Patient 09/30/14 1536     Chief Complaint  Patient presents with  . Dental Pain     (Consider location/radiation/quality/duration/timing/severity/associated sxs/prior Treatment) HPI Comments: 24 y/o M c/o R sided dental pain x 2 days. States he was sitting in line to buy a whopper at [employer] when the pain began. Was not eating at the time. Unsure if the pain is upper or lower, but radiates throughout his right teeth. Unable to describe the pain and not sure how bad it hurts, just states "it hurts". Pain unrelieved by goody powder and tylenol. Does not have a dentist. Denies fever or facial swelling.  Patient is a 24 y.o. male presenting with tooth pain. The history is provided by the patient.  Dental Pain Associated symptoms: no facial swelling and no fever     History reviewed. No pertinent past medical history. History reviewed. No pertinent past surgical history. No family history on file. History  Substance Use Topics  . Smoking status: Current Every Day Smoker -- 0.50 packs/day  . Smokeless tobacco: Never Used  . Alcohol Use: No    Review of Systems  Constitutional: Negative for fever.  HENT: Positive for dental problem. Negative for facial swelling and trouble swallowing.   Respiratory: Negative for apnea.       Allergies  Review of patient's allergies indicates no known allergies.  Home Medications   Prior to Admission medications   Medication Sig Start Date End Date Taking? Authorizing Provider  acetaminophen (TYLENOL) 325 MG tablet Take 650 mg by mouth every 6 (six) hours as needed for pain.    Historical Provider, MD  amoxicillin (AMOXIL) 500 MG capsule Take 1 capsule (500 mg total) by mouth 3 (three) times daily. 09/30/14   Kathrynn Speed, PA-C  aspirin 325 MG tablet Take 325 mg by mouth daily.    Historical Provider, MD  traMADol (ULTRAM) 50 MG tablet Take 1  tablet (50 mg total) by mouth every 6 (six) hours as needed for pain. 01/30/13   Earley Favor, NP   BP 120/60 mmHg  Pulse 92  Temp(Src) 98.7 F (37.1 C) (Oral)  Resp 18  SpO2 100% Physical Exam  Constitutional: He is oriented to person, place, and time. He appears well-developed and well-nourished. No distress.  HENT:  Head: Normocephalic and atraumatic.  Poor dentition. Multiple dental caries and significant tooth decay. R lower posterior gingival inflammation. No dental abscess.  Eyes: Conjunctivae and EOM are normal.  Neck: Normal range of motion. Neck supple.  Cardiovascular: Normal rate, regular rhythm and normal heart sounds.   Pulmonary/Chest: Effort normal and breath sounds normal.  Musculoskeletal: Normal range of motion. He exhibits no edema.  Lymphadenopathy:       Head (right side): No submental and no submandibular adenopathy present.       Head (left side): No submental and no submandibular adenopathy present.    He has no cervical adenopathy.  Neurological: He is alert and oriented to person, place, and time.  Skin: Skin is warm and dry.  Psychiatric: He has a normal mood and affect. His behavior is normal.  Nursing note and vitals reviewed.   ED Course  Procedures (including critical care time) Labs Review Labs Reviewed - No data to display  Imaging Review No results found.   EKG Interpretation None      MDM   Final diagnoses:  Pain due to dental caries  Tooth decay   NAD. Afebrile. Poor dentition. Gingival inflammation noted. No dental abscess or evidence of Ludwig angina. Rx amoxil. Resources for dental f/u given. Stressed importance of f/u. Stable for d/c. Return precautions given. Patient states understanding of treatment care plan and is agreeable.  Kathrynn Speed, PA-C 09/30/14 1612  Pricilla Loveless, MD 10/02/14 [phone]

## 2014-09-30 NOTE — ED Notes (Signed)
Pt states that he has had rt lower tooth pain since yesterday.

## 2014-09-30 NOTE — Discharge Instructions (Signed)
Take amoxicillin as directed for 7 days. Continue tylenol or ibuprofen for pain. Follow up with the dentist.  Dental Pain A tooth ache may be caused by cavities (tooth decay). Cavities expose the nerve of the tooth to air and hot or cold temperatures. It may come from an infection or abscess (also called a boil or furuncle) around your tooth. It is also often caused by dental caries (tooth decay). This causes the pain you are having. DIAGNOSIS  Your caregiver can diagnose this problem by exam. TREATMENT   If caused by an infection, it may be treated with medications which kill germs (antibiotics) and pain medications as prescribed by your caregiver. Take medications as directed.  Only take over-the-counter or prescription medicines for pain, discomfort, or fever as directed by your caregiver.  Whether the tooth ache today is caused by infection or dental disease, you should see your dentist as soon as possible for further care. SEEK MEDICAL CARE IF: The exam and treatment you received today has been provided on an emergency basis only. This is not a substitute for complete medical or dental care. If your problem worsens or new problems (symptoms) appear, and you are unable to meet with your dentist, call or return to this location. SEEK IMMEDIATE MEDICAL CARE IF:   You have a fever.  You develop redness and swelling of your face, jaw, or neck.  You are unable to open your mouth.  You have severe pain uncontrolled by pain medicine. MAKE SURE YOU:   Understand these instructions.  Will watch your condition.  Will get help right away if you are not doing well or get worse. Document Released: 03/29/2005 Document Revised: 06/21/2011 Document Reviewed: 11/15/2007 [facility] Patient Information 2015 [place], [place]. This information is not intended to replace advice given to you by your health care provider. Make sure you discuss any questions you have with your health care provider.  Dental  Caries Dental caries (also called tooth decay) is the most common oral disease. It can occur at any age but is more common in children and young adults.  HOW DENTAL CARIES DEVELOPS  The process of decay begins when bacteria and foods (particularly sugars and starches) combine in your mouth to produce plaque. Plaque is a substance that sticks to the hard, outer surface of a tooth (enamel). The bacteria in plaque produce acids that attack enamel. These acids may also attack the root surface of a tooth (cementum) if it is exposed. Repeated attacks dissolve these surfaces and create holes in the tooth (cavities). If left untreated, the acids destroy the other layers of the tooth.  RISK FACTORS  Frequent sipping of sugary beverages.   Frequent snacking on sugary and starchy foods, especially those that easily get stuck in the teeth.   Poor oral hygiene.   Dry mouth.   Substance abuse such as methamphetamine abuse.   Broken or poor-fitting dental restorations.   Eating disorders.   Gastroesophageal reflux disease (GERD).   Certain radiation treatments to the head and neck. SYMPTOMS In the early stages of dental caries, symptoms are seldom present. Sometimes white, chalky areas may be seen on the enamel or other tooth layers. In later stages, symptoms may include:  Pits and holes on the enamel.  Toothache after sweet, hot, or cold foods or drinks are consumed.  Pain around the tooth.  Swelling around the tooth. DIAGNOSIS  Most of the time, dental caries is detected during a regular dental checkup. A diagnosis is made after  a thorough medical and dental history is taken and the surfaces of your teeth are checked for signs of dental caries. Sometimes special instruments, such as lasers, are used to check for dental caries. Dental X-ray exams may be taken so that areas not visible to the eye (such as between the contact areas of the teeth) can be checked for cavities.  TREATMENT  If  dental caries is in its early stages, it may be reversed with a fluoride treatment or an application of a remineralizing agent at the dental office. Thorough brushing and flossing at home is needed to aid these treatments. If it is in its later stages, treatment depends on the location and extent of tooth destruction:   If a small area of the tooth has been destroyed, the destroyed area will be removed and cavities will be filled with a material such as gold, silver amalgam, or composite resin.   If a large area of the tooth has been destroyed, the destroyed area will be removed and a cap (crown) will be fitted over the remaining tooth structure.   If the center part of the tooth (pulp) is affected, a procedure called a root canal will be needed before a filling or crown can be placed.   If most of the tooth has been destroyed, the tooth may need to be pulled (extracted). HOME CARE INSTRUCTIONS You can prevent, stop, or reverse dental caries at home by practicing good oral hygiene. Good oral hygiene includes:  Thoroughly cleaning your teeth at least twice a day with a toothbrush and dental floss.   Using a fluoride toothpaste. A fluoride mouth rinse may also be used if recommended by your dentist or health care provider.   Restricting the amount of sugary and starchy foods and sugary liquids you consume.   Avoiding frequent snacking on these foods and sipping of these liquids.   Keeping regular visits with a dentist for checkups and cleanings. PREVENTION   Practice good oral hygiene.  Consider a dental sealant. A dental sealant is a coating material that is applied by your dentist to the pits and grooves of teeth. The sealant prevents food from being trapped in them. It may protect the teeth for several years.  Ask about fluoride supplements if you live in a community without fluorinated water or with water that has a low fluoride content. Use fluoride supplements as directed by  your dentist or health care provider.  Allow fluoride varnish applications to teeth if directed by your dentist or health care provider. Document Released: 12/19/2001 Document Revised: 08/13/2013 Document Reviewed: 03/31/2012 [facility] Patient Information 2015 [place], [place]. This information is not intended to replace advice given to you by your health care provider. Make sure you discuss any questions you have with your health care provider.  Dental Care and Dentist Visits Dental care supports good overall health. Regular dental visits can also help you avoid dental pain, bleeding, infection, and other more serious health problems in the future. It is important to keep the mouth healthy because diseases in the teeth, gums, and other oral tissues can spread to other areas of the body. Some problems, such as diabetes, heart disease, and pre-term labor have been associated with poor oral health.  See your dentist every 6 months. If you experience emergency problems such as a toothache or broken tooth, go to the dentist right away. If you see your dentist regularly, you may catch problems early. It is easier to be treated for problems in  the early stages.  WHAT TO EXPECT AT A DENTIST VISIT  Your dentist will look for many common oral health problems and recommend proper treatment. At your regular dental visit, you can expect:  Gentle cleaning of the teeth and gums. This includes scraping and polishing. This helps to remove the sticky substance around the teeth and gums (plaque). Plaque forms in the mouth shortly after eating. Over time, plaque hardens on the teeth as tartar. If tartar is not removed regularly, it can cause problems. Cleaning also helps remove stains.  Periodic X-rays. These pictures of the teeth and supporting bone will help your dentist assess the health of your teeth.  Periodic fluoride treatments. Fluoride is a natural mineral shown to help strengthen teeth. Fluoride  treatmentinvolves applying a fluoride gel or varnish to the teeth. It is most commonly done in children.  Examination of the mouth, tongue, jaws, teeth, and gums to look for any oral health problems, such as:  Cavities (dental caries). This is decay on the tooth caused by plaque, sugar, and acid in the mouth. It is best to catch a cavity when it is small.  Inflammation of the gums caused by plaque buildup (gingivitis).  Problems with the mouth or malformed or misaligned teeth.  Oral cancer or other diseases of the soft tissues or jaws. KEEP YOUR TEETH AND GUMS HEALTHY For healthy teeth and gums, follow these general guidelines as well as your dentist's specific advice:  Have your teeth professionally cleaned at the dentist every 6 months.  Brush twice daily with a fluoride toothpaste.  Floss your teeth daily.  Ask your dentist if you need fluoride supplements, treatments, or fluoride toothpaste.  Eat a healthy diet. Reduce foods and drinks with added sugar.  Avoid smoking. TREATMENT FOR ORAL HEALTH PROBLEMS If you have oral health problems, treatment varies depending on the conditions present in your teeth and gums.  Your caregiver will most likely recommend good oral hygiene at each visit.  For cavities, gingivitis, or other oral health disease, your caregiver will perform a procedure to treat the problem. This is typically done at a separate appointment. Sometimes your caregiver will refer you to another dental specialist for specific tooth problems or for surgery. SEEK IMMEDIATE DENTAL CARE IF:  You have pain, bleeding, or soreness in the gum, tooth, jaw, or mouth area.  A permanent tooth becomes loose or separated from the gum socket.  You experience a blow or injury to the mouth or jaw area. Document Released: 12/09/2010 Document Revised: 06/21/2011 Document Reviewed: 12/09/2010 [facility] Patient Information 2015 [place], [place]. This information is not intended to  replace advice given to you by your health care provider. Make sure you discuss any questions you have with your health care provider.

## 2015-08-13 ENCOUNTER — Emergency Department (HOSPITAL_COMMUNITY)
Admission: EM | Admit: 2015-08-13 | Discharge: 2015-08-13 | Discharge status: Against Medical Advice | Payer: Commercial Indemnity

## 2015-08-13 NOTE — ED Notes (Signed)
Called patient x 3, no answer.  Per [facility], patient had walked outside earlier to go get his keys and never returned.

## 2016-11-20 ENCOUNTER — Encounter (HOSPITAL_COMMUNITY): Payer: Self-pay | Admitting: Emergency Medicine

## 2016-11-20 ENCOUNTER — Emergency Department (HOSPITAL_COMMUNITY)
Admission: EM | Admit: 2016-11-20 | Discharge: 2016-11-20 | Disposition: A | Discharge status: Home / Self Care | Payer: BLUE CROSS/BLUE SHIELD | Attending: Emergency Medicine | Admitting: Emergency Medicine

## 2016-11-20 DIAGNOSIS — K047 Periapical abscess without sinus: Secondary | ICD-10-CM | POA: Diagnosis not present

## 2016-11-20 DIAGNOSIS — Z79899 Other long term (current) drug therapy: Secondary | ICD-10-CM | POA: Insufficient documentation

## 2016-11-20 DIAGNOSIS — F17200 Nicotine dependence, unspecified, uncomplicated: Secondary | ICD-10-CM | POA: Diagnosis not present

## 2016-11-20 DIAGNOSIS — R6884 Jaw pain: Secondary | ICD-10-CM | POA: Diagnosis present

## 2016-11-20 MED ORDER — AMOXICILLIN 500 MG PO CAPS: 1000.0000 mg | ORAL_CAPSULE | Freq: Two times a day (BID) | ORAL | 0 refills | Status: DC

## 2016-11-20 MED ORDER — AMOXICILLIN 500 MG PO CAPS
1000.0000 mg | ORAL_CAPSULE | Freq: Once | ORAL | Status: AC
Start: 2016-11-20 — End: 2016-11-20
  Administered 2016-11-20: 1000 mg via ORAL
  Filled 2016-11-20: qty 2

## 2016-11-20 MED ORDER — BUPIVACAINE-EPINEPHRINE (PF) 0.5% -1:200000 IJ SOLN
1.8000 mL | Freq: Once | INTRAMUSCULAR | Status: AC
Start: 2016-11-20 — End: 2016-11-20
  Administered 2016-11-20: 1.8 mL
  Filled 2016-11-20: qty 1.8

## 2016-11-20 MED ORDER — HYDROCODONE-ACETAMINOPHEN 5-325 MG PO TABS: ORAL_TABLET | ORAL | 0 refills | Status: DC

## 2016-11-20 MED ORDER — MORPHINE SULFATE (PF) 4 MG/ML IV SOLN: 4.0000 mg | Freq: Once | INTRAVENOUS | Status: DC

## 2016-11-20 NOTE — ED Triage Notes (Addendum)
PT reports abscess to right cheek. Has picture of white/green/yellow/and red drainage that came out of it 2 days ago when it "burst." Pt has missing and broken teeth. Reports tooth pain as well. Not currently on antibiotics for this.   Pt also reports swelling to face and left hand from a bee sting several days ago. Left hand has obvious swelling. Pt denies shortness of breath.

## 2016-11-20 NOTE — Discharge Instructions (Signed)
Eat a soft diet for the next 24-48 hours.  Rinse your mouth with saltwater or regular water after you eat any food.  Do not swallow blood or pus becuase it will cause you to vomit,  spit out instead.  Take percocet for breakthrough pain, do not drink alcohol, drive, care for children or do other critical tasks while taking percocet.  Return to the emergency room for fever, change in vision, redness to the face that rapidly spreads towards the eye, nausea or vomiting, difficulty swallowing or shortness of breath.   Apply warm compresses to jaw throughout the day.

## 2016-11-20 NOTE — ED Provider Notes (Signed)
[facility] Provider Note   CSN: [id number] Arrival date & time: 11/20/16  1308     History   Chief Complaint Chief Complaint  Patient presents with  . Abscess     HPI  Blood pressure 127/76, pulse 86, temperature 98.2 F (36.8 C), resp. rate 18, weight 56.4 kg (124 lb 7 oz), SpO2 97 %.  Earl Marks is a 26 y.o. male complaining of Severe right lower jaw pain and swelling worsening over the course of the week, it is not associated with any fever, difficulty swallowing, skin changes, shortness of breath. He states that yesterday the area opened, blood and pus came out and he felt significant relief in his pain. He is presenting to the ED today at the encouragement of his girlfriend.   History reviewed. No pertinent past medical history.  There are no active problems to display for this patient.   No past surgical history on file.     Home Medications    Prior to Admission medications   Medication Sig Start Date End Date Taking? Authorizing Provider  acetaminophen (TYLENOL) 325 MG tablet Take 650 mg by mouth every 6 (six) hours as needed for pain.    [provider]  amoxicillin (AMOXIL) 500 MG capsule Take 2 capsules (1,000 mg total) by mouth 2 (two) times daily. 11/20/16   Shawnice Tilmon, Joni Reining, PA-C  aspirin 325 MG tablet Take 325 mg by mouth daily.    [provider]  HYDROcodone-acetaminophen (NORCO/VICODIN) 5-325 MG tablet Take 1-2 tablets by mouth every 6 hours as needed for pain and/or cough. 11/20/16   Raquon Milledge, Joni Reining, PA-C  traMADol (ULTRAM) 50 MG tablet Take 1 tablet (50 mg total) by mouth every 6 (six) hours as needed for pain. 01/30/13   Earley Favor, NP    Family History No family history on file.  Social History Social History  Substance Use Topics  . Smoking status: Current Every Day Smoker    Packs/day: 0.50  . Smokeless tobacco: Never Used  . Alcohol use No     Allergies   Patient has no known allergies.   Review of  Systems Review of Systems  A complete review of systems was obtained and all systems are negative except as noted in the HPI and PMH.   Physical Exam Updated Vital Signs BP 127/76   Pulse 86   Temp 98.2 F (36.8 C)   Resp 18   Wt 56.4 kg (124 lb 7 oz)   SpO2 97%   Physical Exam  Constitutional: He is oriented to person, place, and time. He appears well-developed and well-nourished. No distress.  HENT:  Head: Normocephalic and atraumatic.  Mouth/Throat: Oropharynx is clear and moist.    Generally poor dentition with deep dental caries. Patient has fluctuant abscess as diagrammed, swelling visible from on the face but with with no facial cellulitis. Patient is handling their secretions. There is no tenderness to palpation or firmness underneath tongue bilaterally. No trismus.      Eyes: Pupils are equal, round, and reactive to light. Conjunctivae and EOM are normal.  Neck: Normal range of motion.  Cardiovascular: Normal rate, regular rhythm and intact distal pulses.   Pulmonary/Chest: Effort normal and breath sounds normal. No respiratory distress. He has no wheezes. He has no rales. He exhibits no tenderness.  Abdominal: Soft. He exhibits no distension and no mass. There is no tenderness. There is no rebound and no guarding. No hernia.  Musculoskeletal: Normal range of motion.  Neurological: He is  alert and oriented to person, place, and time.  Skin: Capillary refill takes less than 2 seconds. He is not diaphoretic.  Psychiatric: He has a normal mood and affect.  Nursing note and vitals reviewed.    ED Treatments / Results  Labs (all labs ordered are listed, but only abnormal results are displayed) Labs Reviewed - No data to display  EKG  EKG Interpretation None       Radiology No results found.  Procedures .Marland Kitchen.Incision and Drainage Date/Time: 11/20/2016 5:42 PM Performed by: Wynetta EmeryPISCIOTTA, Adorian Gwynne Authorized by: Wynetta EmeryPISCIOTTA, Marley Charlot   Consent:    Consent obtained:   Verbal   Consent given by:  Patient Location:    Type:  Abscess   Location:  Mouth   Mouth location:  Alveolar process Anesthesia (see MAR for exact dosages):    Anesthesia method:  Local infiltration and nerve block   Block location:  Inferior alveolar   Block needle gauge:  27 G   Block anesthetic:  Bupivacaine 0.5% WITH epi   Block injection procedure:  Anatomic landmarks identified   Block outcome:  Incomplete block Procedure type:    Complexity:  Simple Procedure details:    Needle aspiration: no     Incision types:  Single straight   Scalpel blade:  11   Wound management:  Probed and deloculated   Drainage:  Bloody   Drainage amount:  Moderate   Wound treatment:  Wound left open Post-procedure details:    Patient tolerance of procedure:  Tolerated with difficulty   (including critical care time)  Medications Ordered in ED Medications  amoxicillin (AMOXIL) capsule 1,000 mg (1,000 mg Oral Given 11/20/16 1623)  bupivacaine-epinephrine (MARCAINE W/ EPI) 0.5% -1:200000 injection 1.8 mL (1.8 mLs Infiltration Given by Other 11/20/16 1623)     Initial Impression / Assessment and Plan / ED Course  I have reviewed the triage vital signs and the nursing notes.  Pertinent labs & imaging results that were available during my care of the patient were reviewed by me and considered in my medical decision making (see chart for details).     Vitals:   11/20/16 1316  BP: 127/76  Pulse: 86  Resp: 18  Temp: 98.2 F (36.8 C)  SpO2: 97%  Weight: 56.4 kg (124 lb 7 oz)    Medications  amoxicillin (AMOXIL) capsule 1,000 mg (1,000 mg Oral Given 11/20/16 1623)  bupivacaine-epinephrine (MARCAINE W/ EPI) 0.5% -1:200000 injection 1.8 mL (1.8 mLs Infiltration Given by Other 11/20/16 1623)    Earl Marks is 26 y.o. male presenting with Dental pain and dental abscess. Patient is started on amoxicillin, I and D is performed. Signs of deep tissue infection  Evaluation does not show pathology  that would require ongoing emergent intervention or inpatient treatment. Pt is hemodynamically stable and mentating appropriately. Discussed findings and plan with patient/guardian, who agrees with care plan. All questions answered. Return precautions discussed and outpatient follow up given.      Final Clinical Impressions(s) / ED Diagnoses   Final diagnoses:  Dentoalveolar abscess    New Prescriptions Discharge Medication List as of 11/20/2016  5:02 PM    START taking these medications   Details  HYDROcodone-acetaminophen (NORCO/VICODIN) 5-325 MG tablet Take 1-2 tablets by mouth every 6 hours as needed for pain and/or cough., Print         Brennley Curtice, Mardella Laymanicole, PA-C 11/20/16 1744    Cathren LaineSteinl, Kevin, MD 11/20/16 2218

## 2017-06-20 ENCOUNTER — Other Ambulatory Visit: Payer: Self-pay

## 2017-06-20 ENCOUNTER — Emergency Department (HOSPITAL_COMMUNITY)
Admission: EM | Admit: 2017-06-20 | Discharge: 2017-06-20 | Discharge status: Against Medical Advice | Payer: BLUE CROSS/BLUE SHIELD

## 2017-11-22 ENCOUNTER — Encounter (HOSPITAL_COMMUNITY): Payer: Self-pay

## 2017-11-22 ENCOUNTER — Ambulatory Visit (HOSPITAL_COMMUNITY)
Admission: EM | Admit: 2017-11-22 | Discharge: 2017-11-22 | Disposition: A | Discharge status: Home / Self Care | Payer: BLUE CROSS/BLUE SHIELD | Attending: Family Medicine | Admitting: Family Medicine

## 2017-11-22 DIAGNOSIS — K047 Periapical abscess without sinus: Secondary | ICD-10-CM | POA: Diagnosis not present

## 2017-11-22 MED ORDER — HYDROCODONE-ACETAMINOPHEN 5-325 MG PO TABS: 1.0000 | ORAL_TABLET | Freq: Four times a day (QID) | ORAL | 0 refills | Status: DC | PRN

## 2017-11-22 MED ORDER — AMOXICILLIN-POT CLAVULANATE 875-125 MG PO TABS: 1.0000 | ORAL_TABLET | Freq: Two times a day (BID) | ORAL | 0 refills | Status: AC

## 2017-11-22 MED ORDER — NAPROXEN 375 MG PO TABS: 375.0000 mg | ORAL_TABLET | Freq: Two times a day (BID) | ORAL | 0 refills | Status: DC | PRN

## 2017-11-22 NOTE — ED Triage Notes (Signed)
Pt presents with generalized dental pain

## 2017-11-22 NOTE — Discharge Instructions (Addendum)
Augmentin prescribed.  Take as directed and to completion Use naproxen as needed for pain Use norco for break-through pain Recommend soft diet until evaluated by dentist Maintain oral hygiene care Follow up with dentist as soon as possible for further evaluation and treatment  Return or go to the ED if you have any new or worsening symptoms

## 2017-11-22 NOTE — ED Provider Notes (Addendum)
[facility]   [id number] 11/22/17 Arrival Time: 1550  CC: dental pain  SUBJECTIVE:  Earl RisingKyle Marks is a 27 y.o. male who reports abrupt onset of upper middle dental pain described as sharp and constant. Denies a precipitating event or trauma.  Symptoms began today. Afebrile. Tolerating PO intake but reports pain with chewing. Normal swallowing. He does not see a dentist regularly. No neck swelling or pain. OTC analgesics and vicodin with relief. Denies fever, chills, nausea, or vomiting.    ROS: As per HPI.  History reviewed. No pertinent past medical history. History reviewed. No pertinent surgical history. No Known Allergies No current facility-administered medications on file prior to encounter.    No current outpatient medications on file prior to encounter.   Social History   Socioeconomic History  . Marital status: Single    Spouse name: Not on file  . Number of children: Not on file  . Years of education: Not on file  . Highest education level: Not on file  Occupational History  . Not on file  Social Needs  . Financial resource strain: Not on file  . Food insecurity:    Worry: Not on file    Inability: Not on file  . Transportation needs:    Medical: Not on file    Non-medical: Not on file  Tobacco Use  . Smoking status: Current Every Day Smoker    Packs/day: 0.50  . Smokeless tobacco: Never Used  Substance and Sexual Activity  . Alcohol use: No  . Drug use: No  . Sexual activity: Not on file  Lifestyle  . Physical activity:    Days per week: Not on file    Minutes per session: Not on file  . Stress: Not on file  Relationships  . Social connections:    Talks on phone: Not on file    Gets together: Not on file    Attends religious service: Not on file    Active member of club or organization: Not on file    Attends meetings of clubs or organizations: Not on file    Relationship status: Not on file  . Intimate partner violence:    Fear of  current or ex partner: Not on file    Emotionally abused: Not on file    Physically abused: Not on file    Forced sexual activity: Not on file  Other Topics Concern  . Not on file  Social History Narrative  . Not on file   History reviewed. No pertinent family history.  OBJECTIVE:  Vitals:   11/22/17 1609  BP: 108/61  Pulse: 77  Resp: 20  Temp: 98.4 F (36.9 C)  TempSrc: Oral  SpO2: 100%    General appearance: alert; no distress HENT: normocephalic; atraumatic; dentition: poor; abscess present- left upper gum over left upper gums without areas of fluctuance; tender to palpation; no active drainage appreciated Neck: supple without LAD Lungs: normal respirations  CV: RRR without murmur, gallops or rubs Skin: warm and dry Psychological: alert and cooperative; normal mood and affect  ASSESSMENT & PLAN:  1. Dental abscess     Meds ordered this encounter  Medications  . amoxicillin-clavulanate (AUGMENTIN) 875-125 MG tablet    Sig: Take 1 tablet by mouth every 12 (twelve) hours for 10 days.    Dispense:  20 tablet    Refill:  0    Order Specific Question:   Supervising Provider    Answer:   Isa RankinMURRAY, LAURA WILSON [phone][[id number]]  .  naproxen (NAPROSYN) 375 MG tablet    Sig: Take 1 tablet (375 mg total) by mouth 2 (two) times daily as needed for moderate pain.    Dispense:  20 tablet    Refill:  0    Order Specific Question:   Supervising Provider    Answer:   Isa RankinMURRAY, LAURA WILSON [phone][[id number]]  . HYDROcodone-acetaminophen (NORCO/VICODIN) 5-325 MG tablet    Sig: Take 1 tablet by mouth every 6 (six) hours as needed for severe pain.    Dispense:  10 tablet    Refill:  0    Order Specific Question:   Supervising Provider    Answer:   Isa RankinMURRAY, LAURA WILSON [[id number]]   Augmentin prescribed.  Take as directed and to completion Use naproxen as needed for pain Use norco for break-through pain Recommend soft diet until evaluated by dentist Maintain oral hygiene care Follow up with dentist  as soon as possible for further evaluation and treatment  Return or go to the ED if you have any new or worsening symptoms  Check [place] Controlled substance.  There was not concern for prescribing this patient a controlled substance at this time.    Reviewed expectations re: course of current medical issues. Questions answered. Outlined signs and symptoms indicating need for more acute intervention. Patient verbalized understanding. After Visit Summary given.     Rennis HardingWurst, Jolyn Deshmukh, PA-C 11/22/17 1651

## 2018-06-19 ENCOUNTER — Ambulatory Visit (HOSPITAL_COMMUNITY)
Admission: EM | Admit: 2018-06-19 | Discharge: 2018-06-19 | Disposition: A | Discharge status: Home / Self Care | Payer: BLUE CROSS/BLUE SHIELD | Attending: Internal Medicine | Admitting: Internal Medicine

## 2018-06-19 ENCOUNTER — Other Ambulatory Visit: Payer: Self-pay

## 2018-06-19 ENCOUNTER — Encounter (HOSPITAL_COMMUNITY): Payer: Self-pay | Admitting: Emergency Medicine

## 2018-06-19 DIAGNOSIS — J069 Acute upper respiratory infection, unspecified: Secondary | ICD-10-CM | POA: Diagnosis not present

## 2018-06-19 NOTE — ED Triage Notes (Signed)
"  not bad today" Saturday had sore throat and abdominal pain, no vomiting.  Patient had nausea.  Continues to complain of abdominal pain and sore throat

## 2018-06-19 NOTE — ED Provider Notes (Signed)
[facility]    CSN: [id number] Arrival date & time: 06/19/18  1813     History   Chief Complaint Chief Complaint  Patient presents with  . URI    HPI Earl Marks is a 27 y.o. male comes to the urgent care today for clearance prior to going to work.  Patient started having sore throat with nausea several days ago.  Symptoms started insidiously and got progressively worse.  His symptoms have improved since then.  No nausea or vomiting.  Patient went to work today and was asked to come for clearance prior to returning to work.  HPI  History reviewed. No pertinent past medical history.  There are no active problems to display for this patient.   History reviewed. No pertinent surgical history.     Home Medications    Prior to Admission medications   Not on File    Family History Family History  Problem Relation Age of Onset  . Healthy Mother   . Healthy Father     Social History Social History   Tobacco Use  . Smoking status: Current Every Day Smoker    Packs/day: 0.50  . Smokeless tobacco: Never Used  Substance Use Topics  . Alcohol use: No  . Drug use: No     Allergies   Patient has no known allergies.   Review of Systems Review of Systems  Constitutional: Negative for activity change and appetite change.  HENT: Negative.  Negative for congestion, postnasal drip and rhinorrhea.   Respiratory: Negative for cough, shortness of breath and wheezing.   Gastrointestinal: Negative for abdominal distention, abdominal pain, nausea and vomiting.  Skin: Negative.   Neurological: Negative.      Physical Exam Triage Vital Signs ED Triage Vitals [06/19/18 1827]  Enc Vitals Group     BP      Pulse      Resp      Temp      Temp src      SpO2      Weight      Height      Head Circumference      Peak Flow      Pain Score 4     Pain Loc      Pain Edu?      Excl. in [place]?    No data found.  Updated Vital Signs BP 126/75 (BP Location:  Right Arm)   Pulse (!) 101   Temp 98.2 F (36.8 C) (Oral)   Resp 18   SpO2 98%   Visual Acuity Right Eye Distance:   Left Eye Distance:   Bilateral Distance:    Right Eye Near:   Left Eye Near:    Bilateral Near:     Physical Exam Constitutional:      General: He is not in acute distress.    Appearance: Normal appearance. He is not ill-appearing or toxic-appearing.  HENT:     Right Ear: Tympanic membrane normal.     Left Ear: Tympanic membrane normal.     Nose: Nose normal. No congestion or rhinorrhea.     Mouth/Throat:     Mouth: Mucous membranes are moist.  Eyes:     Conjunctiva/sclera: Conjunctivae normal.  Cardiovascular:     Rate and Rhythm: Normal rate and regular rhythm.     Pulses: Normal pulses.     Heart sounds: Normal heart sounds.  Pulmonary:     Effort: Pulmonary effort is normal.  Breath sounds: Normal breath sounds.  Abdominal:     General: Bowel sounds are normal.     Palpations: Abdomen is soft.  Musculoskeletal: Normal range of motion.  Neurological:     Mental Status: He is alert.      UC Treatments / Results  Labs (all labs ordered are listed, but only abnormal results are displayed) Labs Reviewed - No data to display  EKG None  Radiology No results found.  Procedures Procedures (including critical care time)  Medications Ordered in UC Medications - No data to display  Initial Impression / Assessment and Plan / UC Course  I have reviewed the triage vital signs and the nursing notes.  Pertinent labs & imaging results that were available during my care of the patient were reviewed by me and considered in my medical decision making (see chart for details).     1.  Acute nasopharyngitis, resolving: Work note given to patient. Continue oral fluid intake Tylenol as needed for pain and fever  Final Clinical Impressions(s) / UC Diagnoses   Final diagnoses:  Viral upper respiratory tract infection   Discharge Instructions     None    ED Prescriptions    None     Controlled Substance Prescriptions [place] Controlled Substance Registry consulted? No   Merrilee Jansky, MD 06/20/18 1510

## 2018-07-18 ENCOUNTER — Encounter (HOSPITAL_COMMUNITY): Payer: Self-pay

## 2018-07-18 ENCOUNTER — Ambulatory Visit (HOSPITAL_COMMUNITY)
Admission: EM | Admit: 2018-07-18 | Discharge: 2018-07-18 | Disposition: A | Discharge status: Home / Self Care | Payer: BLUE CROSS/BLUE SHIELD | Attending: Family Medicine | Admitting: Family Medicine

## 2018-07-18 ENCOUNTER — Other Ambulatory Visit: Payer: Self-pay

## 2018-07-18 DIAGNOSIS — J069 Acute upper respiratory infection, unspecified: Secondary | ICD-10-CM

## 2018-07-18 NOTE — ED Triage Notes (Signed)
Pt cc he needs a work note because he called out for a week. Pt states his allergies were acting up. Pt states she had a cold was well.

## 2018-07-18 NOTE — ED Provider Notes (Addendum)
[facility]   [id number] 07/18/18 Arrival Time: 1440  ASSESSMENT & PLAN:  1. Viral upper respiratory tract infection    Resolved. Afebrile. Return to work note provided. May f/u with PCP or here as needed.  Reviewed expectations re: course of current medical issues. Questions answered. Outlined signs and symptoms indicating need for more acute intervention. Patient verbalized understanding. After Visit Summary given.   SUBJECTIVE: History from: patient.  Rhyen Kellenberger is a 28 y.o. male who reports a mild URI for several days last week requiring him to call out of work. Fever on day one of illness and none since. Mild nasal congestion and dry cough during illness. Symptoms have resolved. Feeling well and wishes to return to work. No concerns.  Social History   Tobacco Use  Smoking Status Current Every Day Smoker  . Packs/day: 0.50  Smokeless Tobacco Never Used    ROS: As per HPI.   OBJECTIVE:  Vitals:   07/18/18 1454 07/18/18 1455  BP:  120/68  Pulse:  (!) 110  Resp:  18  Temp:  98.6 F (37 C)  TempSrc:  Oral  SpO2:  98%  Weight: 63.5 kg      General appearance: alert; no distress HEENT: no nasal congestion; throat normal Neck: supple without LAD CV: RRR (recheck pulse 96) Lungs: unlabored respirations, symmetrical air entry without wheezing; cough: absent Abd: soft Ext: no LE edema Skin: warm and dry Psychological: alert and cooperative; normal mood and affect   No Known Allergies   Family History  Problem Relation Age of Onset  . Healthy Mother   . Healthy Father    Social History   Socioeconomic History  . Marital status: Single    Spouse name: Not on file  . Number of children: Not on file  . Years of education: Not on file  . Highest education level: Not on file  Occupational History  . Not on file  Social Needs  . Financial resource strain: Not on file  . Food insecurity:    Worry: Not on file    Inability: Not on file  .  Transportation needs:    Medical: Not on file    Non-medical: Not on file  Tobacco Use  . Smoking status: Current Every Day Smoker    Packs/day: 0.50  . Smokeless tobacco: Never Used  Substance and Sexual Activity  . Alcohol use: No  . Drug use: No  . Sexual activity: Not on file  Lifestyle  . Physical activity:    Days per week: Not on file    Minutes per session: Not on file  . Stress: Not on file  Relationships  . Social connections:    Talks on phone: Not on file    Gets together: Not on file    Attends religious service: Not on file    Active member of club or organization: Not on file    Attends meetings of clubs or organizations: Not on file    Relationship status: Not on file  . Intimate partner violence:    Fear of current or ex partner: Not on file    Emotionally abused: Not on file    Physically abused: Not on file    Forced sexual activity: Not on file  Other Topics Concern  . Not on file  Social History Narrative  . Not on file           Mardella Layman, MD 07/18/18 1513    Mardella Layman, MD 07/18/18  1513  

## 2019-05-28 ENCOUNTER — Ambulatory Visit (HOSPITAL_COMMUNITY)
Admission: EM | Admit: 2019-05-28 | Discharge: 2019-05-28 | Disposition: A | Discharge status: Home / Self Care | Payer: HRSA Program | Attending: Family Medicine | Admitting: Family Medicine

## 2019-05-28 ENCOUNTER — Other Ambulatory Visit: Payer: Self-pay

## 2019-05-28 ENCOUNTER — Encounter (HOSPITAL_COMMUNITY): Payer: Self-pay

## 2019-05-28 DIAGNOSIS — J029 Acute pharyngitis, unspecified: Secondary | ICD-10-CM

## 2019-05-28 DIAGNOSIS — Z20822 Contact with and (suspected) exposure to covid-19: Secondary | ICD-10-CM

## 2019-05-28 MED ORDER — ONDANSETRON 4 MG PO TBDP: 4.0000 mg | ORAL_TABLET | Freq: Three times a day (TID) | ORAL | 0 refills | Status: AC | PRN

## 2019-05-28 NOTE — ED Triage Notes (Signed)
Pt c/o sore throat onset Saturday night, but improved today. Also c/o HA, body aches, general fatigue. Denies fever, chills, abd pain, nasal congestion. States employer required him to have COVID test.

## 2019-05-28 NOTE — ED Provider Notes (Signed)
[place]    CSN: [id number] Arrival date & time: 05/28/19  1431      History   Chief Complaint Chief Complaint  Patient presents with  . Sore Throat    HPI Earl Marks is a 29 y.o. male no significant past medical history presenting today for evaluation of sore throat, headache and body aches.  Patient states that beginning Saturday he began to develop a sore throat.  Sore throat has improved over the past 2 days and is mild today.  He has had associated headache body aches and fatigue.  Denies any cough congestion.  Employer is requiring Covid testing.  Denies any known exposures.  Denies any diarrhea, does report some intermittent nausea, denies vomiting.  Appetite decreased.  HPI  History reviewed. No pertinent past medical history.  There are no problems to display for this patient.   History reviewed. No pertinent surgical history.     Home Medications    Prior to Admission medications   Medication Sig Start Date End Date Taking? Authorizing Provider  ondansetron (ZOFRAN ODT) 4 MG disintegrating tablet Take 1 tablet (4 mg total) by mouth every 8 (eight) hours as needed for nausea or vomiting. 05/28/19   Langley Ingalls, Elesa Hacker, PA-C    Family History Family History  Problem Relation Age of Onset  . Healthy Mother   . Healthy Father     Social History Social History   Tobacco Use  . Smoking status: Current Every Day Smoker    Packs/day: 0.50  . Smokeless tobacco: Never Used  Substance Use Topics  . Alcohol use: No  . Drug use: No     Allergies   Bee venom and Pineapple   Review of Systems Review of Systems  Constitutional: Positive for appetite change. Negative for activity change, chills, fatigue and fever.  HENT: Positive for sore throat. Negative for congestion, ear pain, rhinorrhea, sinus pressure and trouble swallowing.   Eyes: Negative for discharge and redness.  Respiratory: Negative for cough, chest tightness and shortness of  breath.   Cardiovascular: Negative for chest pain.  Gastrointestinal: Positive for nausea. Negative for abdominal pain, diarrhea and vomiting.  Musculoskeletal: Negative for myalgias.  Skin: Negative for rash.  Neurological: Negative for dizziness, light-headedness and headaches.     Physical Exam Triage Vital Signs ED Triage Vitals  Enc Vitals Group     BP 05/28/19 1534 121/64     Pulse Rate 05/28/19 1534 88     Resp 05/28/19 1534 20     Temp 05/28/19 1534 98.6 F (37 C)     Temp Source 05/28/19 1534 Oral     SpO2 05/28/19 1534 100 %     Weight --      Height --      Head Circumference --      Peak Flow --      Pain Score 05/28/19 1536 3     Pain Loc --      Pain Edu? --      Excl. in [place]? --    No data found.  Updated Vital Signs BP 121/64 (BP Location: Left Arm)   Pulse 88   Temp 98.6 F (37 C) (Oral)   Resp 20   SpO2 100%   Visual Acuity Right Eye Distance:   Left Eye Distance:   Bilateral Distance:    Right Eye Near:   Left Eye Near:    Bilateral Near:     Physical Exam Vitals and nursing note reviewed.  Constitutional:      Appearance: He is well-developed.     Comments: No acute distress  HENT:     Head: Normocephalic and atraumatic.     Ears:     Comments: Bilateral ears without tenderness to palpation of external auricle, tragus and mastoid, EAC's without erythema or swelling, TM's with good bony landmarks and cone of light. Non erythematous.     Nose: Nose normal.     Mouth/Throat:     Comments: Oral mucosa pink and moist, no tonsillar enlargement or exudate. Posterior pharynx patent and nonerythematous, no uvula deviation or swelling. Normal phonation. Eyes:     Conjunctiva/sclera: Conjunctivae normal.  Neck:     Comments: No neck swelling or erythema, lymphadenopathy, full active range of motion Cardiovascular:     Rate and Rhythm: Normal rate.  Pulmonary:     Effort: Pulmonary effort is normal. No respiratory distress.     Comments:  Breathing comfortably at rest, CTABL, no wheezing, rales or other adventitious sounds auscultated Abdominal:     General: There is no distension.  Musculoskeletal:        General: Normal range of motion.     Cervical back: Neck supple.  Skin:    General: Skin is warm and dry.  Neurological:     Mental Status: He is alert and oriented to person, place, and time.      UC Treatments / Results  Labs (all labs ordered are listed, but only abnormal results are displayed) Labs Reviewed  NOVEL CORONAVIRUS, NAA (HOSP ORDER, SEND-OUT TO REF LAB; TAT 18-24 HRS)    EKG   Radiology No results found.  Procedures Procedures (including critical care time)  Medications Ordered in UC Medications - No data to display  Initial Impression / Assessment and Plan / UC Course  I have reviewed the triage vital signs and the nursing notes.  Pertinent labs & imaging results that were available during my care of the patient were reviewed by me and considered in my medical decision making (see chart for details).     Covid PCR pending.  Will have quarantine until results return.  Exam unremarkable, recommending symptomatic and supportive care as likely viral etiology.  Continue ibuprofen and Tylenol as needed for headache and body aches, rest and push fluids.  Zofran as needed for nausea.  Discussed strict return precautions. Patient verbalized understanding and is agreeable with plan.  Final Clinical Impressions(s) / UC Diagnoses   Final diagnoses:  Sore throat  Encounter for laboratory testing for COVID-19 virus     Discharge Instructions     Covid swab pending, quarantine until results return, monitor my chart for results Please rest and drink plenty of fluids Tylenol and ibuprofen for headache, body aches May Zofran as needed for nausea  Please follow-up if symptoms not improving or worsening   ED Prescriptions    Medication Sig Dispense Auth. Provider   ondansetron (ZOFRAN ODT)  4 MG disintegrating tablet Take 1 tablet (4 mg total) by mouth every 8 (eight) hours as needed for nausea or vomiting. 20 tablet Temitope Flammer, [place] C, PA-C     PDMP not reviewed this encounter.   Lew Dawes, [place] 05/28/19 1944

## 2019-05-28 NOTE — Discharge Instructions (Signed)
Covid swab pending, quarantine until results return, monitor my chart for results Please rest and drink plenty of fluids Tylenol and ibuprofen for headache, body aches May Zofran as needed for nausea  Please follow-up if symptoms not improving or worsening

## 2019-05-30 LAB — NOVEL CORONAVIRUS, NAA (HOSP ORDER, SEND-OUT TO REF LAB; TAT 18-24 HRS): SARS-CoV-2, NAA: NOT DETECTED

## 2019-11-29 ENCOUNTER — Ambulatory Visit (HOSPITAL_COMMUNITY)
Admission: EM | Admit: 2019-11-29 | Discharge: 2019-11-29 | Disposition: A | Discharge status: Home / Self Care | Payer: Self-pay

## 2019-11-29 ENCOUNTER — Other Ambulatory Visit: Payer: Self-pay

## 2019-11-30 ENCOUNTER — Other Ambulatory Visit: Payer: Self-pay

## 2019-11-30 ENCOUNTER — Other Ambulatory Visit: Payer: Self-pay | Admitting: Sleep Medicine

## 2019-11-30 DIAGNOSIS — Z20822 Contact with and (suspected) exposure to covid-19: Secondary | ICD-10-CM

## 2019-12-01 LAB — SPECIMEN STATUS REPORT

## 2019-12-01 LAB — SARS-COV-2, NAA 2 DAY TAT

## 2019-12-01 LAB — NOVEL CORONAVIRUS, NAA: SARS-CoV-2, NAA: NOT DETECTED

## 2022-04-02 ENCOUNTER — Ambulatory Visit
Admission: EM | Admit: 2022-04-02 | Discharge: 2022-04-02 | Disposition: A | Discharge status: Home / Self Care | Payer: Managed Care, Other (non HMO) | Attending: Urgent Care | Admitting: Urgent Care

## 2022-04-02 DIAGNOSIS — F17200 Nicotine dependence, unspecified, uncomplicated: Secondary | ICD-10-CM

## 2022-04-02 DIAGNOSIS — J40 Bronchitis, not specified as acute or chronic: Secondary | ICD-10-CM

## 2022-04-02 DIAGNOSIS — J329 Chronic sinusitis, unspecified: Secondary | ICD-10-CM | POA: Diagnosis not present

## 2022-04-02 MED ORDER — PREDNISONE 20 MG PO TABS: ORAL_TABLET | ORAL | 0 refills | Status: AC

## 2022-04-02 MED ORDER — PROMETHAZINE-DM 6.25-15 MG/5ML PO SYRP: 2.5000 mL | ORAL_SOLUTION | Freq: Three times a day (TID) | ORAL | 0 refills | Status: AC | PRN

## 2022-04-02 MED ORDER — BENZONATATE 100 MG PO CAPS: 100.0000 mg | ORAL_CAPSULE | Freq: Three times a day (TID) | ORAL | 0 refills | Status: AC | PRN

## 2022-04-02 MED ORDER — CETIRIZINE HCL 10 MG PO TABS: 10.0000 mg | ORAL_TABLET | Freq: Every day | ORAL | 0 refills | Status: AC

## 2022-04-02 NOTE — ED Triage Notes (Signed)
Pt presents with cough, sore throat, and body aches since Tuesday morning.

## 2022-04-02 NOTE — ED Provider Notes (Signed)
[facility]  Note:  This document was prepared using Conservation officer, historic buildings and may include unintentional dictation errors.  MRN: [id number] DOB: 10/17/1990  Subjective:   Earl Marks is a 31 y.o. male presenting for 4-day history of acute onset persistent fevers, coughing, throat pain, body aches, chest tightness.  No chest pain, shortness of breath or wheezing.  Patient smokes 1 pack/day.  No current facility-administered medications for this encounter.  Current Outpatient Medications:    ondansetron (ZOFRAN ODT) 4 MG disintegrating tablet, Take 1 tablet (4 mg total) by mouth every 8 (eight) hours as needed for nausea or vomiting., Disp: 20 tablet, Rfl: 0   Allergies  Allergen Reactions   Bee Venom Swelling   Pineapple Swelling    History reviewed. No pertinent past medical history.   History reviewed. No pertinent surgical history.  Family History  Problem Relation Age of Onset   Healthy Mother    Healthy Father     Social History   Tobacco Use   Smoking status: Every Day    Packs/day: 0.50    Types: Cigarettes   Smokeless tobacco: Never  Vaping Use   Vaping Use: Never used  Substance Use Topics   Alcohol use: No   Drug use: No    ROS   Objective:   Vitals: BP 109/68   Pulse 92   Temp 99.3 F (37.4 C)   Resp 17   SpO2 95%   Physical Exam Constitutional:      General: He is not in acute distress.    Appearance: Normal appearance. He is well-developed and normal weight. He is not ill-appearing, toxic-appearing or diaphoretic.  HENT:     Head: Normocephalic and atraumatic.     Right Ear: Tympanic membrane, ear canal and external ear normal. No drainage, swelling or tenderness. No middle ear effusion. There is no impacted cerumen. Tympanic membrane is not erythematous or bulging.     Left Ear: Tympanic membrane, ear canal and external ear normal. No drainage, swelling or tenderness.  No middle ear effusion. There is  no impacted cerumen. Tympanic membrane is not erythematous or bulging.     Nose: Congestion and rhinorrhea present.     Mouth/Throat:     Mouth: Mucous membranes are moist.     Pharynx: No oropharyngeal exudate or posterior oropharyngeal erythema.  Eyes:     General: No scleral icterus.       Right eye: No discharge.        Left eye: No discharge.     Extraocular Movements: Extraocular movements intact.     Conjunctiva/sclera: Conjunctivae normal.  Cardiovascular:     Rate and Rhythm: Normal rate and regular rhythm.     Heart sounds: Normal heart sounds. No murmur heard.    No friction rub. No gallop.  Pulmonary:     Effort: Pulmonary effort is normal. No respiratory distress.     Breath sounds: No stridor. Rhonchi (throughout) present. No wheezing or rales.  Musculoskeletal:     Cervical back: Normal range of motion and neck supple. No rigidity. No muscular tenderness.  Neurological:     General: No focal deficit present.     Mental Status: He is alert and oriented to person, place, and time.  Psychiatric:        Mood and Affect: Mood normal.        Behavior: Behavior normal.        Thought Content: Thought content normal.  Assessment and Plan :   PDMP not reviewed this encounter.  1. Sinobronchitis   2. Smoker     Recommended managing for sinobronchitis with an oral prednisone course especially in the context of his smoking.  Use supportive care otherwise for the underlying viral illness. Deferred imaging given clear cardiopulmonary exam, hemodynamically stable vital signs. Counseled patient on potential for adverse effects with medications prescribed/recommended today, ER and return-to-clinic precautions discussed, patient verbalized understanding.    Wallis Bamberg, PA-C 04/02/22 1451
